# Patient Record
Sex: Female | Born: 1945
Health system: Southern US, Community
[De-identification: ages and names within clinical notes are randomized; demographics above are authoritative.]

## PROBLEM LIST (undated history)

## (undated) DIAGNOSIS — J349 Unspecified disorder of nose and nasal sinuses: Secondary | ICD-10-CM

## (undated) DIAGNOSIS — M4800 Spinal stenosis, site unspecified: Secondary | ICD-10-CM

## (undated) HISTORY — PX: TUBAL LIGATION: SHX77

## (undated) HISTORY — PX: MYOMECTOMY: SHX85

---

## 1999-09-26 ENCOUNTER — Other Ambulatory Visit: Admission: RE | Admit: 1999-09-26 | Discharge: 1999-09-26 | Payer: Self-pay | Admitting: Obstetrics and Gynecology

## 2000-10-08 ENCOUNTER — Other Ambulatory Visit: Admission: RE | Admit: 2000-10-08 | Discharge: 2000-10-08 | Payer: Self-pay | Admitting: Obstetrics and Gynecology

## 2001-10-15 ENCOUNTER — Other Ambulatory Visit: Admission: RE | Admit: 2001-10-15 | Discharge: 2001-10-15 | Payer: Self-pay | Admitting: Obstetrics and Gynecology

## 2003-08-30 ENCOUNTER — Emergency Department (HOSPITAL_COMMUNITY): Admission: EM | Admit: 2003-08-30 | Discharge: 2003-08-30 | Payer: Self-pay | Admitting: Emergency Medicine

## 2003-08-30 ENCOUNTER — Encounter: Payer: Self-pay | Admitting: Emergency Medicine

## 2003-10-09 ENCOUNTER — Other Ambulatory Visit: Admission: RE | Admit: 2003-10-09 | Discharge: 2003-10-09 | Payer: Self-pay | Admitting: Obstetrics and Gynecology

## 2014-02-20 ENCOUNTER — Other Ambulatory Visit: Payer: Self-pay | Admitting: Obstetrics and Gynecology

## 2014-02-20 DIAGNOSIS — R1031 Right lower quadrant pain: Secondary | ICD-10-CM

## 2014-02-23 ENCOUNTER — Ambulatory Visit
Admission: RE | Admit: 2014-02-23 | Discharge: 2014-02-23 | Disposition: A | Discharge status: Home / Self Care | Payer: BC Managed Care – PPO | Source: Ambulatory Visit | Attending: Obstetrics and Gynecology | Admitting: Obstetrics and Gynecology

## 2014-02-23 DIAGNOSIS — R1031 Right lower quadrant pain: Secondary | ICD-10-CM

## 2014-02-23 MED ORDER — IOHEXOL 300 MG/ML  SOLN
100.0000 mL | Freq: Once | INTRAMUSCULAR | Status: AC | PRN
  Administered 2014-02-23: 100 mL via INTRAVENOUS

## 2014-03-08 ENCOUNTER — Encounter: Payer: Self-pay | Admitting: *Deleted

## 2014-03-09 ENCOUNTER — Ambulatory Visit (INDEPENDENT_AMBULATORY_CARE_PROVIDER_SITE_OTHER): Payer: BC Managed Care – PPO | Admitting: Neurology

## 2014-03-09 ENCOUNTER — Encounter: Payer: Self-pay | Admitting: Neurology

## 2014-03-09 ENCOUNTER — Encounter (INDEPENDENT_AMBULATORY_CARE_PROVIDER_SITE_OTHER): Payer: Self-pay

## 2014-03-09 VITALS — BP 120/75 | HR 88 | Ht 59.0 in | Wt 158.0 lb

## 2014-03-09 DIAGNOSIS — R209 Unspecified disturbances of skin sensation: Secondary | ICD-10-CM

## 2014-03-09 DIAGNOSIS — R202 Paresthesia of skin: Secondary | ICD-10-CM

## 2014-03-09 NOTE — Patient Instructions (Signed)
Overall you are doing fairly well but I do want to suggest a few things today:   Remember to drink plenty of fluid, eat healthy meals and do not skip any meals. Try to eat protein with a every meal and eat a healthy snack such as fruit or nuts in between meals. Try to keep a regular sleep-wake schedule and try to exercise daily, particularly in the form of walking, 20-30 minutes a day, if you can.   Please have some blood work completed after checking out Please schedule an EMG/NCS  Please follow up once the workup is completed. Please call us with any interim questions, concerns, problems, updates or refill requests.   My clinical assistant and will answer any of your questions and relay your messages to me and also relay most of my messages to you.   Our phone number is [phone]. We also have an after hours call service for urgent matters and there is a physician on-call for urgent questions. For any emergencies you know to call 911 or go to the nearest emergency room

## 2014-03-09 NOTE — Progress Notes (Signed)
[facility]    Provider:  Dr Janann Colonel Referring Provider: Lovenia Kim, MD Primary Care Physician:  Orpah Melter, MD  CC:  Right sided pain  HPI:  Rebecca Stone is a 68 y.o. female here as a referral from Dr. Ronita Hipps for right sided pain  Symptoms started around 1 month ago, initially noted lumbar back pain, started on a steroid taper pack which resolved the lower back pain. After that, started having a  burning sensation in right inguinal area and the right lateral gastrocnemius. Wakes up feeling fine, after a few hours of walking/standing pain starts, is then constant until she sits down. Pain is only in those 2 spots, is non-radiating. Severe burning pain, not exacerbated by touch of clothes or wind. No symptoms at night while sleeping. Symptoms improved with pain medication. No weakness. No difficulty walking. No symptoms in LLE or bilateral UE. No change in bowel or bladder function.   No DM, no HTN, no strokes or TIAs. Was bit by a dog on right medial gastrocnemius, no sensory or motor changes at that time. Told she had a hematoma in her leg after the dog bite which self resolved.   Review of Systems: Out of a complete 14 system review, the patient complains of only the following symptoms, and all other reviewed systems are negative. + paresthesias  History   Social History  . Marital Status: Married    Spouse Name: N/A    Number of Children: N/A  . Years of Education: N/A   Occupational History  . Not on file.   Social History Main Topics  . Smoking status: Never Smoker   . Smokeless tobacco: Never Used  . Alcohol Use: No  . Drug Use: No  . Sexual Activity: Not on file   Other Topics Concern  . Not on file   Social History Narrative   Married, 1 child   Right handed   11 th grade   3 cups daily    Family History  Problem Relation Age of Onset  . Diabetes Mother   . Diabetes Sister   . Cancer Brother     Lung  . Alzheimer's  disease Father   . Hypertension Mother   . Heart disease Mother   . Heart attack Mother     No past medical history on file.  Past Surgical History  Procedure Laterality Date  . Myomectomy      Current Outpatient Prescriptions  Medication Sig Dispense Refill  . ibuprofen (ADVIL,MOTRIN) 200 MG tablet Take 200 mg by mouth every 6 (six) hours as needed.       No current facility-administered medications for this visit.    Allergies as of 03/09/2014  . (No Known Allergies)    Vitals: BP 120/75  Pulse 88  Ht 4\' 11"  (1.499 m)  Wt 158 lb (71.668 kg)  BMI 31.89 kg/m2 Last Weight:  Wt Readings from Last 1 Encounters:  03/09/14 158 lb (71.668 kg)   Last Height:   Ht Readings from Last 1 Encounters:  03/09/14 4\' 11"  (1.499 m)     Physical exam: Exam: Gen: NAD, conversant Eyes: anicteric sclerae, moist conjunctivae HENT: Atraumatic, oropharynx clear Neck: Trachea midline; supple,  Lungs: CTA, no wheezing, rales, rhonic                          CV: RRR, no MRG Abdomen: Soft, non-tender;  Extremities: No peripheral edema  Skin: Normal temperature, no  rash,  Psych: Appropriate affect, pleasant  Neuro: MS: AA&Ox3, appropriately interactive, normal affect   Attention: WORLD backwards  Speech: fluent w/o paraphasic error  Memory: good recent and remote recall  CN: PERRL, EOMI no nystagmus, no ptosis, sensation intact to LT V1-V3 bilat, face symmetric, no weakness, hearing grossly intact, palate elevates symmetrically, shoulder shrug 5/5 bilat,  tongue protrudes midline, no fasiculations noted.  Motor: normal bulk and tone Strength: 5/5  In all extremities  Coord: rapid alternating and point-to-point (FNF, HTS) movements intact.  Reflexes: symmetrical, bilat downgoing toes  Sens: LT intact in all extremities, intact PP, temp vibration and proprioception in bilateral LE  Gait: posture, stance, stride and arm-swing normal. Tandem gait intact. Able to walk on  heels and toes. Romberg absent.   Assessment:  After physical and neurologic examination, review of laboratory studies, imaging, neurophysiology testing and pre-existing records, assessment will be reviewed on the problem list.  Plan:  Treatment plan and additional workup will be reviewed under Problem List.  1)Paresthesias  68y/o woman presenting for initial evaluation of right sided painful paresthesias located in right inguinal area and lateral portion of right gastrocnemius. Occur intermittently, not present during exam. Unclear etiology. Exam overall unremarkable. Will check lab work up for possible neuropathy and check EMG/NCS. Follow up once workup completed.    Jim Like, DO  [facility] [address] [place], [place] [address]  Phone [phone] Fax [phone]

## 2014-03-10 ENCOUNTER — Ambulatory Visit: Payer: BC Managed Care – PPO | Admitting: Neurology

## 2014-03-13 LAB — PROTEIN ELECTROPHORESIS
A/G Ratio: 1.5 (ref 0.7–2.0)
ALPHA 2: 0.6 g/dL (ref 0.4–1.2)
Albumin ELP: 4.2 g/dL (ref 3.2–5.6)
Alpha 1: 0.2 g/dL (ref 0.1–0.4)
BETA: 1 g/dL (ref 0.6–1.3)
Gamma Globulin: 0.9 g/dL (ref 0.5–1.6)
Globulin, Total: 2.8 g/dL (ref 2.0–4.5)
Total Protein: 7 g/dL (ref 6.0–8.5)

## 2014-03-13 LAB — VITAMIN B12: VITAMIN B 12: 310 pg/mL (ref 211–946)

## 2014-03-13 LAB — HGB A1C W/O EAG: Hgb A1c MFr Bld: 5.9 % — ABNORMAL HIGH (ref 4.8–5.6)

## 2014-03-13 LAB — TSH: TSH: 2.02 u[IU]/mL (ref 0.450–4.500)

## 2014-03-13 LAB — METHYLMALONIC ACID, SERUM: METHYLMALONIC ACID: 179 nmol/L (ref 0–378)

## 2014-03-14 NOTE — Progress Notes (Signed)
Quick Note:  Left voice message of normal labs and elevated blood sugar, and to follow up with PCP for further management of this. ______

## 2014-03-21 ENCOUNTER — Encounter (INDEPENDENT_AMBULATORY_CARE_PROVIDER_SITE_OTHER): Payer: BC Managed Care – PPO

## 2014-03-21 ENCOUNTER — Ambulatory Visit (INDEPENDENT_AMBULATORY_CARE_PROVIDER_SITE_OTHER): Payer: BC Managed Care – PPO | Admitting: Neurology

## 2014-03-21 DIAGNOSIS — Z0289 Encounter for other administrative examinations: Secondary | ICD-10-CM

## 2014-03-21 DIAGNOSIS — R202 Paresthesia of skin: Secondary | ICD-10-CM

## 2014-03-21 DIAGNOSIS — M79609 Pain in unspecified limb: Secondary | ICD-10-CM

## 2014-03-21 NOTE — Procedures (Signed)
     HISTORY:  Rebecca Stone is a 68 year old patient with a history of discomfort in the right inguinal area and in the right calf muscle that began approximately 4 weeks prior to this evaluation. The patient has had some low back pain around the time of onset of symptoms. The patient is being evaluated for possible neuropathy or a lumbosacral radiculopathy.  NERVE CONDUCTION STUDIES:  Nerve conduction studies were performed on both lower extremities. The distal motor latencies and motor amplitudes for the peroneal and posterior tibial nerves were within normal limits. The nerve conduction velocities for these nerves were also normal. The H reflex latencies were normal. The sensory latencies for the peroneal nerves were within normal limits.   EMG STUDIES:  EMG study was performed on the right lower extremity:  The tibialis anterior muscle reveals 2 to 4K motor units with full recruitment. No fibrillations or positive waves were seen. The peroneus tertius muscle reveals 2 to 4K motor units with full recruitment. No fibrillations or positive waves were seen. The medial gastrocnemius muscle reveals 1 to 3K motor units with full recruitment. No fibrillations or positive waves were seen. The vastus lateralis muscle reveals 2 to 4K motor units with full recruitment. No fibrillations or positive waves were seen. The iliopsoas muscle reveals 2 to 4K motor units with full recruitment. No fibrillations or positive waves were seen. The biceps femoris muscle (long head) reveals 2 to 4K motor units with full recruitment. No fibrillations or positive waves were seen. The lumbosacral paraspinal muscles were tested at 3 levels, and revealed no abnormalities of insertional activity at all 3 levels tested. There was good relaxation.   IMPRESSION:  Nerve conduction studies done on both lower extremities were unremarkable, without evidence of a peripheral neuropathy. EMG evaluation of the right lower  extremity was unremarkable, without evidence of an overlying lumbosacral radiculopathy.  Jill Alexanders MD 03/21/2014 2:00 PM  [facility] [address] [place], [place] [address]  Phone [phone] Fax [phone]

## 2014-03-24 ENCOUNTER — Telehealth: Payer: Self-pay | Admitting: Neurology

## 2014-03-24 ENCOUNTER — Other Ambulatory Visit: Payer: Self-pay | Admitting: Neurology

## 2014-03-24 DIAGNOSIS — M5416 Radiculopathy, lumbar region: Secondary | ICD-10-CM

## 2014-03-24 NOTE — Telephone Encounter (Signed)
Spoke with patient's husband and he said should the  wife be requesting a MRI to detect a bad nerve. The patient is no better after all the other test that she has had

## 2014-03-24 NOTE — Telephone Encounter (Signed)
Wants to know if Dr. Janann Colonel will be ordering an MRI for her

## 2014-03-29 ENCOUNTER — Ambulatory Visit (INDEPENDENT_AMBULATORY_CARE_PROVIDER_SITE_OTHER): Payer: BC Managed Care – PPO

## 2014-03-29 DIAGNOSIS — M5416 Radiculopathy, lumbar region: Secondary | ICD-10-CM

## 2014-03-29 DIAGNOSIS — IMO0002 Reserved for concepts with insufficient information to code with codable children: Secondary | ICD-10-CM

## 2014-04-03 NOTE — Progress Notes (Signed)
Quick Note:  Left voice message for patient to f/u with orthopaedic back doctor, any questions or concerns to give the office a call back. ______

## 2014-04-05 ENCOUNTER — Telehealth: Payer: Self-pay | Admitting: Neurology

## 2014-04-05 NOTE — Telephone Encounter (Signed)
Patient calling wanting to discs her MRI results before she sees her bone doctor. Please call and advise patient.

## 2014-04-05 NOTE — Telephone Encounter (Signed)
Spoke to patient and she just wanted to know why she was to make an appt with her orthopedic back doctor. I explained to her it was for her to try steroid injections in her back to help with her pain. Patient verbalizes understating and will try to get an appt. If patient needs any results for appt she will call the office back.

## 2014-04-07 ENCOUNTER — Telehealth: Payer: Self-pay | Admitting: Neurology

## 2014-04-07 NOTE — Telephone Encounter (Signed)
Pt called states the Bone Dr says they will not see pt until they receive all of her medical records and want to know why she is being referred over to them. Please call pt concerning this matter.

## 2014-04-10 ENCOUNTER — Telehealth: Payer: Self-pay | Admitting: *Deleted

## 2014-04-10 ENCOUNTER — Encounter: Payer: Self-pay | Admitting: *Deleted

## 2014-04-10 ENCOUNTER — Other Ambulatory Visit: Payer: Self-pay | Admitting: Neurology

## 2014-04-10 DIAGNOSIS — M5416 Radiculopathy, lumbar region: Secondary | ICD-10-CM

## 2014-04-10 NOTE — Telephone Encounter (Signed)
Called patient and left a voicemail letting her know, Dr. Janann Colonel will be referring her to Dr. Ace Gins a pain doctor and once he gets that in and its processed someone will call from there.

## 2014-04-10 NOTE — Telephone Encounter (Signed)
Please let her know I will place a referral to Dr Ace Gins, a pain doctor, to see if she would benefit from steroid injections to treat her symptoms. She does not need to see her bone doctor

## 2014-04-10 NOTE — Telephone Encounter (Signed)
Refer to message below

## 2014-04-10 NOTE — Telephone Encounter (Addendum)
Referred patient to Dr Neomia Dear, patient is requesting a doctor with a different ethnicity.

## 2014-04-11 ENCOUNTER — Encounter: Payer: Self-pay | Admitting: Neurology

## 2014-06-12 ENCOUNTER — Other Ambulatory Visit: Payer: Self-pay | Admitting: Orthopedic Surgery

## 2014-06-12 DIAGNOSIS — M48061 Spinal stenosis, lumbar region without neurogenic claudication: Secondary | ICD-10-CM

## 2014-06-15 ENCOUNTER — Ambulatory Visit
Admission: RE | Admit: 2014-06-15 | Discharge: 2014-06-15 | Disposition: A | Discharge status: Home / Self Care | Payer: BC Managed Care – PPO | Source: Ambulatory Visit | Attending: Orthopedic Surgery | Admitting: Orthopedic Surgery

## 2014-06-15 ENCOUNTER — Inpatient Hospital Stay
Admission: RE | Admit: 2014-06-15 | Discharge: 2014-06-15 | Disposition: A | Discharge status: Home / Self Care | Payer: Self-pay | Source: Ambulatory Visit | Attending: Orthopedic Surgery | Admitting: Orthopedic Surgery

## 2014-06-15 ENCOUNTER — Other Ambulatory Visit: Payer: Self-pay | Admitting: Orthopedic Surgery

## 2014-06-15 VITALS — BP 109/57 | HR 74

## 2014-06-15 DIAGNOSIS — M48061 Spinal stenosis, lumbar region without neurogenic claudication: Secondary | ICD-10-CM

## 2014-06-15 MED ORDER — IOHEXOL 180 MG/ML  SOLN
15.0000 mL | Freq: Once | INTRAMUSCULAR | Status: AC | PRN
  Administered 2014-06-15: 15 mL via INTRATHECAL

## 2014-06-15 MED ORDER — DIAZEPAM 5 MG PO TABS
5.0000 mg | ORAL_TABLET | Freq: Once | ORAL | Status: AC
  Administered 2014-06-15: 5 mg via ORAL

## 2014-06-15 NOTE — Discharge Instructions (Signed)

## 2014-07-11 ENCOUNTER — Other Ambulatory Visit: Payer: Self-pay | Admitting: Surgical

## 2014-07-24 ENCOUNTER — Encounter (HOSPITAL_COMMUNITY): Payer: Self-pay | Admitting: Pharmacist

## 2014-07-26 ENCOUNTER — Encounter (INDEPENDENT_AMBULATORY_CARE_PROVIDER_SITE_OTHER): Payer: Self-pay

## 2014-07-26 ENCOUNTER — Ambulatory Visit (HOSPITAL_COMMUNITY)
Admission: RE | Admit: 2014-07-26 | Discharge: 2014-07-26 | Disposition: A | Discharge status: Home / Self Care | Payer: BC Managed Care – PPO | Source: Ambulatory Visit | Attending: Surgical | Admitting: Surgical

## 2014-07-26 ENCOUNTER — Encounter (HOSPITAL_COMMUNITY)
Admission: RE | Admit: 2014-07-26 | Discharge: 2014-07-26 | Disposition: A | Discharge status: Home / Self Care | Payer: BC Managed Care – PPO | Source: Ambulatory Visit | Attending: Orthopedic Surgery | Admitting: Orthopedic Surgery

## 2014-07-26 ENCOUNTER — Encounter (HOSPITAL_COMMUNITY): Payer: Self-pay

## 2014-07-26 DIAGNOSIS — M549 Dorsalgia, unspecified: Secondary | ICD-10-CM | POA: Diagnosis present

## 2014-07-26 DIAGNOSIS — Z01818 Encounter for other preprocedural examination: Secondary | ICD-10-CM | POA: Diagnosis not present

## 2014-07-26 HISTORY — DX: Spinal stenosis, site unspecified: M48.00

## 2014-07-26 HISTORY — DX: Unspecified disorder of nose and nasal sinuses: J34.9

## 2014-07-26 LAB — CBC
HCT: 38.3 % (ref 36.0–46.0)
Hemoglobin: 12.9 g/dL (ref 12.0–15.0)
MCH: 29.9 pg (ref 26.0–34.0)
MCHC: 33.7 g/dL (ref 30.0–36.0)
MCV: 88.9 fL (ref 78.0–100.0)
Platelets: 206 10*3/uL (ref 150–400)
RBC: 4.31 MIL/uL (ref 3.87–5.11)
RDW: 13 % (ref 11.5–15.5)
WBC: 5.2 10*3/uL (ref 4.0–10.5)

## 2014-07-26 LAB — URINALYSIS, ROUTINE W REFLEX MICROSCOPIC
Bilirubin Urine: NEGATIVE
Glucose, UA: NEGATIVE mg/dL
Hgb urine dipstick: NEGATIVE
Ketones, ur: NEGATIVE mg/dL
Leukocytes, UA: NEGATIVE
Nitrite: NEGATIVE
Protein, ur: NEGATIVE mg/dL
Specific Gravity, Urine: 1.011 (ref 1.005–1.030)
Urobilinogen, UA: 0.2 mg/dL (ref 0.0–1.0)
pH: 7 (ref 5.0–8.0)

## 2014-07-26 LAB — COMPREHENSIVE METABOLIC PANEL
ALT: 16 U/L (ref 0–35)
AST: 19 U/L (ref 0–37)
Albumin: 3.9 g/dL (ref 3.5–5.2)
Alkaline Phosphatase: 70 U/L (ref 39–117)
Anion gap: 12 (ref 5–15)
BUN: 14 mg/dL (ref 6–23)
CO2: 26 mEq/L (ref 19–32)
Calcium: 9.6 mg/dL (ref 8.4–10.5)
Chloride: 102 mEq/L (ref 96–112)
Creatinine, Ser: 0.81 mg/dL (ref 0.50–1.10)
GFR calc Af Amer: 85 mL/min — ABNORMAL LOW (ref >90)
GFR calc non Af Amer: 73 mL/min — ABNORMAL LOW (ref >90)
Glucose, Bld: 104 mg/dL — ABNORMAL HIGH (ref 70–99)
Potassium: 4 mEq/L (ref 3.7–5.3)
Sodium: 140 mEq/L (ref 137–147)
Total Bilirubin: 0.4 mg/dL (ref 0.3–1.2)
Total Protein: 7.3 g/dL (ref 6.0–8.3)

## 2014-07-26 LAB — PROTIME-INR
INR: 1 (ref 0.00–1.49)
Prothrombin Time: 13.2 seconds (ref 11.6–15.2)

## 2014-07-26 LAB — SURGICAL PCR SCREEN
MRSA, PCR: NEGATIVE
STAPHYLOCOCCUS AUREUS: NEGATIVE

## 2014-07-26 LAB — APTT: aPTT: 37 seconds (ref 24–37)

## 2014-07-26 NOTE — Patient Instructions (Addendum)
[place]  07/26/2014   Your procedure is scheduled on: 9-2  -2015 Wednesday  Enter through [facility] Entrance and follow signs to [facility]. Arrive at  0630      AM .  Call this number if you have problems the morning of surgery: [phone]  Or Presurgical Testing [phone].   For Living Will and/or Health Care Power Attorney Forms: please provide copy for your medical record,may bring AM of surgery(Forms should be already notarized -we do not provide this service).(07-26-14  No information preferred today).    Do not eat food/ or drink: After Midnight.      Take these medicines the morning of surgery with A SIP OF WATER: none. Bring/use eyedrops   Do not wear jewelry, make-up or nail polish.  Do not wear lotions, powders, or perfumes. You may wear deodorant.  Do not shave 48 hours(2 days) prior to first CHG shower(legs and under arms).(Shaving face and neck okay.)  Do not bring valuables to the hospital.(Hospital is not responsible for lost valuables).  Contacts, dentures or removable bridgework, body piercing, hair pins may not be worn into surgery.  Leave suitcase in the car. After surgery it may be brought to your room.  For patients admitted to the hospital, checkout time is 11:00 AM the day of discharge.(Restricted visitors-Any Persons displaying flu-like symptoms or illness).    Patients discharged the day of surgery will not be allowed to drive home. Must have responsible person with you x 24 hours once discharged.  Name and phone number of your driver: Ronald-spouse [phone] h  Special Instructions: CHG(Chlorhedine 4%-"Hibiclens","Betasept","Aplicare") Shower Use Special Wash: see special instructions.(avoid face and genitals)   Please read over the following fact sheets that you were given: MRSA Information, Incentive Spirometry Instruction.  Remember : Type/Screen "Blue armbands" - may not be removed once applied(would result in being  retested AM of surgery, if removed).    _________________________    [facility] - Preparing for Surgery Before surgery, you can play an important role.  Because skin is not sterile, your skin needs to be as free of germs as possible.  You can reduce the number of germs on your skin by washing with CHG (chlorahexidine gluconate) soap before surgery.  CHG is an antiseptic cleaner which kills germs and bonds with the skin to continue killing germs even after washing. Please DO NOT use if you have an allergy to CHG or antibacterial soaps.  If your skin becomes reddened/irritated stop using the CHG and inform your nurse when you arrive at Short Stay. Do not shave (including legs and underarms) for at least 48 hours prior to the first CHG shower.  You may shave your face/neck. Please follow these instructions carefully:  1.  Shower with CHG Soap the night before surgery and the  morning of Surgery.  2.  If you choose to wash your hair, wash your hair first as usual with your  normal  shampoo.  3.  After you shampoo, rinse your hair and body thoroughly to remove the  shampoo.                           4.  Use CHG as you would any other liquid soap.  You can apply chg directly  to the skin and wash  Gently with a scrungie or clean washcloth.  5.  Apply the CHG Soap to your body ONLY FROM THE NECK DOWN.   Do not use on face/ open                           Wound or open sores. Avoid contact with eyes, ears mouth and genitals (private parts).                       Wash face,  Genitals (private parts) with your normal soap.             6.  Wash thoroughly, paying special attention to the area where your surgery  will be performed.  7.  Thoroughly rinse your body with warm water from the neck down.  8.  DO NOT shower/wash with your normal soap after using and rinsing off  the CHG Soap.                9.  Pat yourself dry with a clean towel.            10.  Wear clean pajamas.             11.  Place clean sheets on your bed the night of your first shower and do not  sleep with pets. Day of Surgery : Do not apply any lotions/deodorants the morning of surgery.  Please wear clean clothes to the hospital/surgery center.  FAILURE TO FOLLOW THESE INSTRUCTIONS MAY RESULT IN THE CANCELLATION OF YOUR SURGERY PATIENT SIGNATURE_________________________________  NURSE SIGNATURE__________________________________  ________________________________________________________________________   Adam Phenix  An incentive spirometer is a tool that can help keep your lungs clear and active. This tool measures how well you are filling your lungs with each breath. Taking long deep breaths may help reverse or decrease the chance of developing breathing (pulmonary) problems (especially infection) following:  A long period of time when you are unable to move or be active. BEFORE THE PROCEDURE   If the spirometer includes an indicator to show your best effort, your nurse or respiratory therapist will set it to a desired goal.  If possible, sit up straight or lean slightly forward. Try not to slouch.  Hold the incentive spirometer in an upright position. INSTRUCTIONS FOR USE  1. Sit on the edge of your bed if possible, or sit up as far as you can in bed or on a chair. 2. Hold the incentive spirometer in an upright position. 3. Breathe out normally. 4. Place the mouthpiece in your mouth and seal your lips tightly around it. 5. Breathe in slowly and as deeply as possible, raising the piston or the ball toward the top of the column. 6. Hold your breath for 3-5 seconds or for as long as possible. Allow the piston or ball to fall to the bottom of the column. 7. Remove the mouthpiece from your mouth and breathe out normally. 8. Rest for a few seconds and repeat Steps 1 through 7 at least 10 times every 1-2 hours when you are awake. Take your time and take a few normal breaths between deep  breaths. 9. The spirometer may include an indicator to show your best effort. Use the indicator as a goal to work toward during each repetition. 10. After each set of 10 deep breaths, practice coughing to be sure your lungs are clear. If you have an incision (the cut made at the time of  surgery), support your incision when coughing by placing a pillow or rolled up towels firmly against it. Once you are able to get out of bed, walk around indoors and cough well. You may stop using the incentive spirometer when instructed by your caregiver.  RISKS AND COMPLICATIONS  Take your time so you do not get dizzy or light-headed.  If you are in pain, you may need to take or ask for pain medication before doing incentive spirometry. It is harder to take a deep breath if you are having pain. AFTER USE  Rest and breathe slowly and easily.  It can be helpful to keep track of a log of your progress. Your caregiver can provide you with a simple table to help with this. If you are using the spirometer at home, follow these instructions: [place] IF:   You are having difficultly using the spirometer.  You have trouble using the spirometer as often as instructed.  Your pain medication is not giving enough relief while using the spirometer.  You develop fever of 100.5 F (38.1 C) or higher. SEEK IMMEDIATE MEDICAL CARE IF:   You cough up bloody sputum that had not been present before.  You develop fever of 102 F (38.9 C) or greater.  You develop worsening pain at or near the incision site. MAKE SURE YOU:   Understand these instructions.  Will watch your condition.  Will get help right away if you are not doing well or get worse. Document Released: 03/30/2007 Document Revised: 02/09/2012 Document Reviewed: 05/31/2007 [facility] Patient Information 2014 [place], [place].   ________________________________________________________________________

## 2014-07-26 NOTE — Pre-Procedure Instructions (Signed)
07-26-14 EKG, CXR ,Lumbar spine xray done today.

## 2014-08-02 ENCOUNTER — Encounter (HOSPITAL_COMMUNITY): Payer: Self-pay

## 2014-08-02 ENCOUNTER — Ambulatory Visit (HOSPITAL_COMMUNITY): Payer: BC Managed Care – PPO

## 2014-08-02 ENCOUNTER — Ambulatory Visit (HOSPITAL_COMMUNITY): Payer: BC Managed Care – PPO | Admitting: *Deleted

## 2014-08-02 ENCOUNTER — Encounter (HOSPITAL_COMMUNITY)
Admission: RE | Disposition: A | Discharge status: Home / Self Care | Payer: Self-pay | Source: Ambulatory Visit | Attending: Orthopedic Surgery

## 2014-08-02 ENCOUNTER — Encounter (HOSPITAL_COMMUNITY): Payer: BC Managed Care – PPO | Admitting: *Deleted

## 2014-08-02 ENCOUNTER — Ambulatory Visit (HOSPITAL_COMMUNITY)
Admission: RE | Admit: 2014-08-02 | Discharge: 2014-08-03 | Disposition: A | Discharge status: Home / Self Care | Payer: BC Managed Care – PPO | Source: Ambulatory Visit | Attending: Orthopedic Surgery | Admitting: Orthopedic Surgery

## 2014-08-02 DIAGNOSIS — M48061 Spinal stenosis, lumbar region without neurogenic claudication: Secondary | ICD-10-CM | POA: Insufficient documentation

## 2014-08-02 DIAGNOSIS — M48062 Spinal stenosis, lumbar region with neurogenic claudication: Secondary | ICD-10-CM | POA: Diagnosis present

## 2014-08-02 DIAGNOSIS — M216X9 Other acquired deformities of unspecified foot: Secondary | ICD-10-CM | POA: Diagnosis not present

## 2014-08-02 HISTORY — PX: DECOMPRESSIVE LUMBAR LAMINECTOMY LEVEL 1: SHX5791

## 2014-08-02 LAB — ABO/RH: ABO/RH(D): O POS

## 2014-08-02 LAB — TYPE AND SCREEN
ABO/RH(D): O POS
Antibody Screen: NEGATIVE

## 2014-08-02 SURGERY — DECOMPRESSIVE LUMBAR LAMINECTOMY LEVEL 1
Anesthesia: General | Site: Back

## 2014-08-02 MED ORDER — HYDROMORPHONE HCL PF 1 MG/ML IJ SOLN
0.2500 mg | INTRAMUSCULAR | Status: DC | PRN
  Administered 2014-08-02 (×3): 0.5 mg via INTRAVENOUS

## 2014-08-02 MED ORDER — LIDOCAINE HCL (CARDIAC) 20 MG/ML IV SOLN
INTRAVENOUS | Status: DC | PRN
  Administered 2014-08-02: 50 mg via INTRAVENOUS

## 2014-08-02 MED ORDER — HYDROMORPHONE HCL PF 1 MG/ML IJ SOLN
INTRAMUSCULAR | Status: AC
  Filled 2014-08-02: qty 1

## 2014-08-02 MED ORDER — BUPIVACAINE-EPINEPHRINE (PF) 0.5% -1:200000 IJ SOLN
INTRAMUSCULAR | Status: AC
  Filled 2014-08-02: qty 30

## 2014-08-02 MED ORDER — HYDROMORPHONE HCL PF 1 MG/ML IJ SOLN
0.5000 mg | INTRAMUSCULAR | Status: DC | PRN
  Administered 2014-08-02: 1 mg via INTRAVENOUS
  Filled 2014-08-02: qty 1

## 2014-08-02 MED ORDER — PHENOL 1.4 % MT LIQD: 1.0000 | OROMUCOSAL | Status: DC | PRN

## 2014-08-02 MED ORDER — HYDROCODONE-ACETAMINOPHEN 5-325 MG PO TABS: 1.0000 | ORAL_TABLET | ORAL | Status: DC | PRN

## 2014-08-02 MED ORDER — GLYCOPYRROLATE 0.2 MG/ML IJ SOLN
INTRAMUSCULAR | Status: DC | PRN
  Administered 2014-08-02: 0.6 mg via INTRAVENOUS

## 2014-08-02 MED ORDER — ROCURONIUM BROMIDE 100 MG/10ML IV SOLN
INTRAVENOUS | Status: AC
  Filled 2014-08-02: qty 1

## 2014-08-02 MED ORDER — ACETAMINOPHEN 650 MG RE SUPP: 650.0000 mg | RECTAL | Status: DC | PRN

## 2014-08-02 MED ORDER — ONDANSETRON HCL 4 MG/2ML IJ SOLN
INTRAMUSCULAR | Status: AC
  Filled 2014-08-02: qty 2

## 2014-08-02 MED ORDER — SODIUM CHLORIDE 0.9 % IR SOLN
Status: AC
  Filled 2014-08-02: qty 1

## 2014-08-02 MED ORDER — FENTANYL CITRATE 0.05 MG/ML IJ SOLN
INTRAMUSCULAR | Status: AC
  Filled 2014-08-02: qty 5

## 2014-08-02 MED ORDER — MIDAZOLAM HCL 5 MG/5ML IJ SOLN
INTRAMUSCULAR | Status: DC | PRN
  Administered 2014-08-02 (×2): 1 mg via INTRAVENOUS

## 2014-08-02 MED ORDER — BACITRACIN-NEOMYCIN-POLYMYXIN 400-5-5000 EX OINT
TOPICAL_OINTMENT | CUTANEOUS | Status: DC | PRN
  Administered 2014-08-02: 1 via TOPICAL

## 2014-08-02 MED ORDER — BISACODYL 5 MG PO TBEC: 5.0000 mg | DELAYED_RELEASE_TABLET | Freq: Every day | ORAL | Status: DC | PRN

## 2014-08-02 MED ORDER — DEXAMETHASONE SODIUM PHOSPHATE 10 MG/ML IJ SOLN
INTRAMUSCULAR | Status: DC | PRN
  Administered 2014-08-02: 5 mg via INTRAVENOUS

## 2014-08-02 MED ORDER — BUPIVACAINE-EPINEPHRINE (PF) 0.5% -1:200000 IJ SOLN
INTRAMUSCULAR | Status: DC | PRN
  Administered 2014-08-02: 20 mL

## 2014-08-02 MED ORDER — PROMETHAZINE HCL 25 MG/ML IJ SOLN
6.2500 mg | Freq: Four times a day (QID) | INTRAMUSCULAR | Status: DC | PRN
  Administered 2014-08-02: 12.5 mg via INTRAVENOUS
  Filled 2014-08-02: qty 1

## 2014-08-02 MED ORDER — FLEET ENEMA 7-19 GM/118ML RE ENEM: 1.0000 | ENEMA | Freq: Once | RECTAL | Status: AC | PRN

## 2014-08-02 MED ORDER — MEPERIDINE HCL 50 MG/ML IJ SOLN: 6.2500 mg | INTRAMUSCULAR | Status: DC | PRN

## 2014-08-02 MED ORDER — BACITRACIN-NEOMYCIN-POLYMYXIN 400-5-5000 EX OINT
TOPICAL_OINTMENT | CUTANEOUS | Status: AC
  Filled 2014-08-02: qty 1

## 2014-08-02 MED ORDER — PROMETHAZINE HCL 25 MG/ML IJ SOLN: 6.2500 mg | INTRAMUSCULAR | Status: DC | PRN

## 2014-08-02 MED ORDER — POLYETHYLENE GLYCOL 3350 17 G PO PACK: 17.0000 g | PACK | Freq: Every day | ORAL | Status: DC | PRN

## 2014-08-02 MED ORDER — ROCURONIUM BROMIDE 100 MG/10ML IV SOLN
INTRAVENOUS | Status: DC | PRN
  Administered 2014-08-02: 15 mg via INTRAVENOUS
  Administered 2014-08-02: 45 mg via INTRAVENOUS

## 2014-08-02 MED ORDER — ONDANSETRON HCL 4 MG/2ML IJ SOLN
INTRAMUSCULAR | Status: DC | PRN
  Administered 2014-08-02: 4 mg via INTRAVENOUS

## 2014-08-02 MED ORDER — BUPIVACAINE-EPINEPHRINE (PF) 0.5% -1:200000 IJ SOLN
INTRAMUSCULAR | Status: AC
Start: 2014-08-02 — End: 2014-08-02
  Filled 2014-08-02: qty 30

## 2014-08-02 MED ORDER — ONDANSETRON HCL 4 MG/2ML IJ SOLN
4.0000 mg | INTRAMUSCULAR | Status: DC | PRN
  Administered 2014-08-02: 4 mg via INTRAVENOUS
  Filled 2014-08-02: qty 2

## 2014-08-02 MED ORDER — PROPOFOL 10 MG/ML IV BOLUS
INTRAVENOUS | Status: AC
  Filled 2014-08-02: qty 20

## 2014-08-02 MED ORDER — CEFAZOLIN SODIUM-DEXTROSE 2-3 GM-% IV SOLR
2.0000 g | INTRAVENOUS | Status: AC
  Administered 2014-08-02: 2 g via INTRAVENOUS

## 2014-08-02 MED ORDER — BUPIVACAINE HCL (PF) 0.5 % IJ SOLN
INTRAMUSCULAR | Status: AC
  Filled 2014-08-02: qty 30

## 2014-08-02 MED ORDER — LACTATED RINGERS IV SOLN: INTRAVENOUS | Status: DC

## 2014-08-02 MED ORDER — SODIUM CHLORIDE 0.9 % IR SOLN
Status: DC | PRN
  Administered 2014-08-02: 09:00:00

## 2014-08-02 MED ORDER — METHOCARBAMOL 500 MG PO TABS: 500.0000 mg | ORAL_TABLET | Freq: Four times a day (QID) | ORAL | Status: DC | PRN

## 2014-08-02 MED ORDER — MIDAZOLAM HCL 2 MG/2ML IJ SOLN
INTRAMUSCULAR | Status: AC
  Filled 2014-08-02: qty 2

## 2014-08-02 MED ORDER — LACTATED RINGERS IV SOLN
INTRAVENOUS | Status: DC
  Administered 2014-08-02 (×2): via INTRAVENOUS
  Administered 2014-08-02: 1000 mL via INTRAVENOUS

## 2014-08-02 MED ORDER — OXYCODONE-ACETAMINOPHEN 5-325 MG PO TABS: 1.0000 | ORAL_TABLET | ORAL | Status: DC | PRN

## 2014-08-02 MED ORDER — NEOSTIGMINE METHYLSULFATE 10 MG/10ML IV SOLN
INTRAVENOUS | Status: DC | PRN
  Administered 2014-08-02: 4 mg via INTRAVENOUS

## 2014-08-02 MED ORDER — MENTHOL 3 MG MT LOZG: 1.0000 | LOZENGE | OROMUCOSAL | Status: DC | PRN

## 2014-08-02 MED ORDER — BUPIVACAINE LIPOSOME 1.3 % IJ SUSP
INTRAMUSCULAR | Status: DC | PRN
  Administered 2014-08-02: 20 mL

## 2014-08-02 MED ORDER — ACETAMINOPHEN 325 MG PO TABS
650.0000 mg | ORAL_TABLET | ORAL | Status: DC | PRN
  Administered 2014-08-02 – 2014-08-03 (×2): 650 mg via ORAL
  Filled 2014-08-02 (×2): qty 2

## 2014-08-02 MED ORDER — OXYCODONE-ACETAMINOPHEN 5-325 MG PO TABS
1.0000 | ORAL_TABLET | ORAL | Status: DC | PRN
  Administered 2014-08-02 – 2014-08-03 (×2): 1 via ORAL
  Filled 2014-08-02 (×2): qty 1

## 2014-08-02 MED ORDER — METOCLOPRAMIDE HCL 5 MG/ML IJ SOLN
INTRAMUSCULAR | Status: DC | PRN
  Administered 2014-08-02: 10 mg via INTRAVENOUS

## 2014-08-02 MED ORDER — PHENYLEPHRINE HCL 10 MG/ML IJ SOLN
INTRAMUSCULAR | Status: DC | PRN
  Administered 2014-08-02: 40 ug via INTRAVENOUS

## 2014-08-02 MED ORDER — FENTANYL CITRATE 0.05 MG/ML IJ SOLN
INTRAMUSCULAR | Status: AC
  Filled 2014-08-02: qty 2

## 2014-08-02 MED ORDER — ONDANSETRON HCL 4 MG PO TABS: 4.0000 mg | ORAL_TABLET | Freq: Three times a day (TID) | ORAL | Status: DC | PRN

## 2014-08-02 MED ORDER — DEXTROSE 5 % IV SOLN
500.0000 mg | Freq: Four times a day (QID) | INTRAVENOUS | Status: DC | PRN
  Administered 2014-08-02: 500 mg via INTRAVENOUS
  Filled 2014-08-02: qty 5

## 2014-08-02 MED ORDER — OXYCODONE HCL 5 MG/5ML PO SOLN: 5.0000 mg | Freq: Once | ORAL | Status: DC | PRN

## 2014-08-02 MED ORDER — BUPIVACAINE LIPOSOME 1.3 % IJ SUSP
20.0000 mL | Freq: Once | INTRAMUSCULAR | Status: DC
  Filled 2014-08-02: qty 20

## 2014-08-02 MED ORDER — THROMBIN 5000 UNITS EX SOLR
CUTANEOUS | Status: DC | PRN
  Administered 2014-08-02: 10000 [IU] via TOPICAL

## 2014-08-02 MED ORDER — LIDOCAINE HCL (CARDIAC) 20 MG/ML IV SOLN
INTRAVENOUS | Status: AC
  Filled 2014-08-02: qty 5

## 2014-08-02 MED ORDER — OXYCODONE HCL 5 MG PO TABS: 5.0000 mg | ORAL_TABLET | Freq: Once | ORAL | Status: DC | PRN

## 2014-08-02 MED ORDER — FENTANYL CITRATE 0.05 MG/ML IJ SOLN
INTRAMUSCULAR | Status: DC | PRN
  Administered 2014-08-02 (×3): 50 ug via INTRAVENOUS
  Administered 2014-08-02: 100 ug via INTRAVENOUS

## 2014-08-02 MED ORDER — CEFAZOLIN SODIUM-DEXTROSE 2-3 GM-% IV SOLR
INTRAVENOUS | Status: AC
  Filled 2014-08-02: qty 50

## 2014-08-02 MED ORDER — THROMBIN 5000 UNITS EX SOLR
CUTANEOUS | Status: AC
  Filled 2014-08-02: qty 10000

## 2014-08-02 MED ORDER — PROPOFOL 10 MG/ML IV BOLUS
INTRAVENOUS | Status: DC | PRN
  Administered 2014-08-02: 160 mg via INTRAVENOUS
  Administered 2014-08-02: 20 mg via INTRAVENOUS

## 2014-08-02 MED ORDER — CEFAZOLIN SODIUM 1-5 GM-% IV SOLN
1.0000 g | Freq: Three times a day (TID) | INTRAVENOUS | Status: AC
  Administered 2014-08-02 – 2014-08-03 (×3): 1 g via INTRAVENOUS
  Filled 2014-08-02 (×3): qty 50

## 2014-08-02 SURGICAL SUPPLY — 40 items
BAG ZIPLOCK 12X15 (MISCELLANEOUS) ×3 IMPLANT
BENZOIN TINCTURE PRP APPL 2/3 (GAUZE/BANDAGES/DRESSINGS) ×3 IMPLANT
CLEANER TIP ELECTROSURG 2X2 (MISCELLANEOUS) ×3 IMPLANT
DRAIN PENROSE 18X1/4 LTX STRL (WOUND CARE) IMPLANT
DRAPE MICROSCOPE LEICA (MISCELLANEOUS) ×3 IMPLANT
DRAPE POUCH INSTRU U-SHP 10X18 (DRAPES) ×3 IMPLANT
DRAPE SURG 17X11 SM STRL (DRAPES) ×3 IMPLANT
DRSG ADAPTIC 3X8 NADH LF (GAUZE/BANDAGES/DRESSINGS) ×3 IMPLANT
DRSG PAD ABDOMINAL 8X10 ST (GAUZE/BANDAGES/DRESSINGS) ×9 IMPLANT
DURAPREP 26ML APPLICATOR (WOUND CARE) ×3 IMPLANT
ELECT REM PT RETURN 9FT ADLT (ELECTROSURGICAL) ×3
ELECTRODE REM PT RTRN 9FT ADLT (ELECTROSURGICAL) ×1 IMPLANT
GAUZE SPONGE 4X4 12PLY STRL (GAUZE/BANDAGES/DRESSINGS) ×3 IMPLANT
GLOVE BIOGEL PI IND STRL 8 (GLOVE) ×1 IMPLANT
GLOVE BIOGEL PI INDICATOR 8 (GLOVE) ×2
GLOVE ECLIPSE 8.0 STRL XLNG CF (GLOVE) ×9 IMPLANT
GOWN STRL REUS W/TWL LRG LVL3 (GOWN DISPOSABLE) ×3 IMPLANT
GOWN STRL REUS W/TWL XL LVL3 (GOWN DISPOSABLE) ×6 IMPLANT
KIT BASIN OR (CUSTOM PROCEDURE TRAY) ×3 IMPLANT
KIT POSITIONING SURG ANDREWS (MISCELLANEOUS) ×3 IMPLANT
MANIFOLD NEPTUNE II (INSTRUMENTS) ×3 IMPLANT
NEEDLE SPNL 18GX3.5 QUINCKE PK (NEEDLE) ×9 IMPLANT
NS IRRIG 1000ML POUR BTL (IV SOLUTION) ×3 IMPLANT
PATTIES SURGICAL .5 X.5 (GAUZE/BANDAGES/DRESSINGS) IMPLANT
PATTIES SURGICAL .75X.75 (GAUZE/BANDAGES/DRESSINGS) IMPLANT
PATTIES SURGICAL 1X1 (DISPOSABLE) IMPLANT
PIN SAFETY NICK PLATE  2 MED (MISCELLANEOUS)
PIN SAFETY NICK PLATE 2 MED (MISCELLANEOUS) IMPLANT
POSITIONER SURGICAL ARM (MISCELLANEOUS) ×3 IMPLANT
SPONGE LAP 4X18 X RAY DECT (DISPOSABLE) ×6 IMPLANT
SPONGE SURGIFOAM ABS GEL 100 (HEMOSTASIS) ×3 IMPLANT
STAPLER VISISTAT 35W (STAPLE) ×3 IMPLANT
SUT VIC AB 0 CT1 27 (SUTURE)
SUT VIC AB 0 CT1 27XBRD ANTBC (SUTURE) IMPLANT
SUT VIC AB 1 CT1 27 (SUTURE) ×6
SUT VIC AB 1 CT1 27XBRD ANTBC (SUTURE) ×3 IMPLANT
TOWEL OR 17X26 10 PK STRL BLUE (TOWEL DISPOSABLE) ×3 IMPLANT
TRAY FOLEY CATH 14FRSI W/METER (CATHETERS) ×3 IMPLANT
TRAY LAMINECTOMY (CUSTOM PROCEDURE TRAY) ×3 IMPLANT
WATER STERILE IRR 1500ML POUR (IV SOLUTION) ×3 IMPLANT

## 2014-08-02 NOTE — Anesthesia Postprocedure Evaluation (Signed)
Anesthesia Post Note  Patient: Rebecca Stone  Procedure(s) Performed: Procedure(s) (LRB): CENTRAL DECOMPRESSIVE LUMBAR LAMINECTOMY L4 - L5 LEVEL 1 (N/A)  Anesthesia type: General  Patient location: PACU  Post pain: Pain level controlled  Post assessment: Post-op Vital signs reviewed  Last Vitals: BP 141/87  Pulse 112  Temp(Src) 36.5 C (Oral)  Resp 18  Ht 4\' 10"  (1.473 m)  Wt 154 lb (69.854 kg)  BMI 32.19 kg/m2  SpO2 100%  Post vital signs: Reviewed  Level of consciousness: sedated  Complications: No apparent anesthesia complications

## 2014-08-02 NOTE — Discharge Instructions (Signed)
Change your dressing daily. °Shower only, no tub bath. °Call if any temperatures greater than 101 or any wound complications: 545-5000 during the day and ask for Dr. Gioffre's nurse, Tammy Johnson. °

## 2014-08-02 NOTE — Interval H&P Note (Signed)
History and Physical Interval Note:  08/02/2014 8:18 AM  Rebecca Stone  has presented today for surgery, with the diagnosis of spinal stenosis  The various methods of treatment have been discussed with the patient and family. After consideration of risks, benefits and other options for treatment, the patient has consented to  Procedure(s): CENTRAL DECOMPRESSIVE LUMBAR LAMINECTOMY L4 - L5 LEVEL 1 (N/A) as a surgical intervention .  The patient's history has been reviewed, patient examined, no change in status, stable for surgery.  I have reviewed the patient's chart and labs.  Questions were answered to the patient's satisfaction.     Ahsley Attwood A

## 2014-08-02 NOTE — H&P (Signed)
Rebecca Stone is an 68 y.o. female.   Chief Complaint: pain in Right leg HPI: Patient with sever,progressive pain in right leg.  Past Medical History  Diagnosis Date  . Sinus trouble     occ.  Marland Kitchen Spinal stenosis     surgery planned    Past Surgical History  Procedure Laterality Date  . Myomectomy    . Tubal ligation      Family History  Problem Relation Age of Onset  . Diabetes Mother   . Diabetes Sister   . Cancer Brother     Lung  . Alzheimer's disease Father   . Hypertension Mother   . Heart disease Mother   . Heart attack Mother    Social History:  reports that she has never smoked. She has never used smokeless tobacco. She reports that she does not drink alcohol or use illicit drugs.  Allergies: No Known Allergies  Medications Prior to Admission  Medication Sig Dispense Refill  . acetaminophen (TYLENOL) 500 MG tablet Take 500 mg by mouth every 6 (six) hours as needed.      Marland Kitchen ibuprofen (ADVIL,MOTRIN) 200 MG tablet Take 400 mg by mouth every 8 (eight) hours as needed for moderate pain.       Marland Kitchen OVER THE COUNTER MEDICATION Place 1 drop into both eyes at bedtime as needed (dry eyes). [employer] brand artificial tears.        Results for orders placed during the hospital encounter of 08/02/14 (from the past 48 hour(s))  TYPE AND SCREEN     Status: None   Collection Time    08/02/14  7:00 AM      Result Value Ref Range   ABO/RH(D) O POS     Antibody Screen NEG     Sample Expiration 08/05/2014    ABO/RH     Status: None   Collection Time    08/02/14  7:00 AM      Result Value Ref Range   ABO/RH(D) O POS     No results found.  Review of Systems  Constitutional: Negative.   HENT: Negative.   Eyes: Negative.   Respiratory: Negative.   Cardiovascular: Negative.   Gastrointestinal: Negative.   Genitourinary: Negative.   Musculoskeletal: Positive for back pain.  Skin: Negative.   Neurological: Positive for focal weakness.  Endo/Heme/Allergies: Negative.    Psychiatric/Behavioral: Negative.     Blood pressure 128/57, pulse 77, temperature 97.7 F (36.5 C), temperature source Oral, resp. rate 16, SpO2 100.00%. Physical Exam  Constitutional: She appears well-developed.  HENT:  Head: Normocephalic.  Eyes: Pupils are equal, round, and reactive to light.  Neck: Normal range of motion.  Cardiovascular: Normal rate.   Respiratory: Effort normal.  GI: Soft.  Musculoskeletal:  Positive Straight Leg on the Right and weakness of right Dorsiflexors.  Neurological: She is alert.  Skin: Skin is warm.     Assessment/Plan Decompressive Lumbar Laminectomy of L-4-L-5  Rebecca Stone A 08/02/2014, 8:13 AM

## 2014-08-02 NOTE — Transfer of Care (Signed)
Immediate Anesthesia Transfer of Care Note  Patient: Rebecca Stone  Procedure(s) Performed: Procedure(s): CENTRAL DECOMPRESSIVE LUMBAR LAMINECTOMY L4 - L5 LEVEL 1 (N/A)  Patient Location: PACU  Anesthesia Type:General  Level of Consciousness: awake, oriented and patient cooperative  Airway & Oxygen Therapy: Patient Spontanous Breathing and Patient connected to face mask oxygen  Post-op Assessment: Report given to PACU RN, Post -op Vital signs reviewed and stable and Patient moving all extremities  Post vital signs: Reviewed and stable  Complications: No apparent anesthesia complications

## 2014-08-02 NOTE — Brief Op Note (Signed)
08/02/2014  10:35 AM  PATIENT:  Rebecca Stone  68 y.o. female  PRE-OPERATIVE DIAGNOSIS:  spinal stenosis L-4-L-5 with weakness of Dorsiflexors on the right. Foraminal Stenosis of L-4 and L-5 nerv roots on the left  POST-OPERATIVE DIAGNOSIS:  Same as Pre-Op  PROCEDURE:  Procedure(s): CENTRAL DECOMPRESSIVE LUMBAR LAMINECTOMY L4 - L5 LEVEL 1 (N/A) and foraminotomies of L-4 and L-5 nerve roots.  SURGEON:  Surgeon(s) and Role:    * Tobi Bastos, MD - Primary    * Magnus Sinning, MD - Assisting     ASSISTANTS: Tarri Glenn MD   ANESTHESIA:   general  EBL:  Total I/O In: 1000 [I.V.:1000] Out: 185 [Urine:135; Blood:50]  BLOOD ADMINISTERED:none  DRAINS: none   LOCAL MEDICATIONS USED:  MARCAINE 20cc of 0.5% with Epinephrine at the start of the case and 20cc of Exparel at the end of the case.    SPECIMEN:  No Specimen  DISPOSITION OF SPECIMEN:  N/A  COUNTS:  YES  TOURNIQUET:  * No tourniquets in log *  DICTATION: .Other Dictation: Dictation Number [id number]  PLAN OF CARE: Admit to inpatient   PATIENT DISPOSITION:  Stable   Delay start of Pharmacological VTE agent (>24hrs) due to surgical blood loss or risk of bleeding: yes

## 2014-08-02 NOTE — Anesthesia Preprocedure Evaluation (Addendum)
Anesthesia Evaluation  Patient identified by MRN, date of birth, ID band Patient awake    Reviewed: Allergy & Precautions, H&P , NPO status , Patient's Chart, lab work & pertinent test results  Airway Mallampati: II TM Distance: >3 FB Neck ROM: Full    Dental no notable dental hx.    Pulmonary neg pulmonary ROS,  breath sounds clear to auscultation  Pulmonary exam normal       Cardiovascular negative cardio ROS  Rhythm:Regular Rate:Normal     Neuro/Psych negative neurological ROS  negative psych ROS   GI/Hepatic negative GI ROS, Neg liver ROS,   Endo/Other  negative endocrine ROS  Renal/GU negative Renal ROS     Musculoskeletal negative musculoskeletal ROS (+)   Abdominal   Peds  Hematology negative hematology ROS (+)   Anesthesia Other Findings   Reproductive/Obstetrics negative OB ROS                           Anesthesia Physical Anesthesia Plan  ASA: II  Anesthesia Plan: General   Post-op Pain Management:    Induction: Intravenous  Airway Management Planned: Oral ETT  Additional Equipment:   Intra-op Plan:   Post-operative Plan: Extubation in OR  Informed Consent: I have reviewed the patients History and Physical, chart, labs and discussed the procedure including the risks, benefits and alternatives for the proposed anesthesia with the patient or authorized representative who has indicated his/her understanding and acceptance.   Dental advisory given  Plan Discussed with: CRNA  Anesthesia Plan Comments:         Anesthesia Quick Evaluation

## 2014-08-03 ENCOUNTER — Encounter (HOSPITAL_COMMUNITY): Payer: Self-pay | Admitting: Orthopedic Surgery

## 2014-08-03 DIAGNOSIS — M48061 Spinal stenosis, lumbar region without neurogenic claudication: Secondary | ICD-10-CM | POA: Diagnosis not present

## 2014-08-03 LAB — GLUCOSE, CAPILLARY: Glucose-Capillary: 119 mg/dL — ABNORMAL HIGH (ref 70–99)

## 2014-08-03 MED ORDER — ONDANSETRON HCL 4 MG PO TABS
4.0000 mg | ORAL_TABLET | ORAL | Status: DC | PRN
  Administered 2014-08-03: 4 mg via ORAL
  Filled 2014-08-03: qty 1

## 2014-08-03 NOTE — Progress Notes (Signed)
RNs Tewana Bohlen and Cleophas Dunker went to see pt for discharge instructions at 0940. Pt sitting up in chair, dressed and ready.  IV catheter removed and gauze dressing on lower back changed. Pt suddenly reported feeling dizzy and hot.  Room temperature turned down, cool cloth applied to forehead and ice applied to back of neck with pt in sitting.  A few minutes later (about 10-15 minutes after RNs entered room), pt had two quick body twitches, leaned slightly to the left, and was asked if she was ok.  Pt's eyes were open, but she wasn't verbalizing a response.  Continued to ask pt, while RN shook her shoulders to alert her, but pt nonverbal for about 1-2 minutes.  Eyes open except briefly closing for about 10 seconds toward the end of period.  During first minute, rapid response call initiated.  After this 1-2 minutes, VS taken: BP 73/44, HR 63, RR 16, O2 sats 99% on RA, regular respiration and HR when auscultated.  Pt returned to bed with assistance, laying down VS:  BP 130/68, HR 105.  After about 5 minutes, BP 118/57, HR 80.  Pt reported feeling better.  Rapid response nurse present during assessments and involved in care of pt. PA was contacted.  No new orders given.  Will continue to monitor pt.

## 2014-08-03 NOTE — Evaluation (Signed)
Physical Therapy Evaluation Patient Details Name: Rebecca Stone MRN: [id number] DOB: 06/13/1946 Today's Date: 08/03/2014   History of Present Illness  CENTRAL DECOMPRESSIVE LUMBAR LAMINECTOMY L4 - L5 LEVEL   Clinical Impression  Pt tolerated ambulation well, no  Issues with hypotension. Ready for DC.    Follow Up Recommendations No PT follow up    Equipment Recommendations  None recommended by PT    Recommendations for Other Services       Precautions / Restrictions Precautions Precautions: Back      Mobility  Bed Mobility   Bed Mobility: Sit to Supine       Sit to supine: Min assist   General bed mobility comments: assist for legs onto bed, cues for technique  Transfers Overall transfer level: Needs assistance Equipment used: 1 person hand held assist Transfers: Sit to/from Stand Sit to Stand: Min guard         General transfer comment: from recliner, mod I from toilet  Ambulation/Gait Ambulation/Gait assistance: Min guard   Assistive device: 1 person hand held assist Gait Pattern/deviations: Step-through pattern Gait velocity: slow, guarded disatance 100'   General Gait Details: pt  moves slowly, declined RW but does better with HH of 1  Stairs            Wheelchair Mobility    Modified Rankin (Stroke Patients Only)       Balance                                             Pertinent Vitals/Pain Pain Score: 3  Pain Descriptors / Indicators: Aching;Tightness Pain Intervention(s): Monitored during session;Repositioned    Home Living Family/patient expects to be discharged to:: Private residence Living Arrangements: Spouse/significant other Available Help at Discharge: Family Type of Home: House Home Access: Stairs to enter     Home Layout: One level Home Equipment: None      Prior Function Level of Independence: Independent               Hand Dominance        Extremity/Trunk Assessment   Upper  Extremity Assessment: Overall WFL for tasks assessed           Lower Extremity Assessment: Generalized weakness         Communication   Communication: No difficulties  Cognition Arousal/Alertness: Awake/alert Behavior During Therapy: WFL for tasks assessed/performed Overall Cognitive Status: Within Functional Limits for tasks assessed                      General Comments      Exercises        Assessment/Plan    PT Assessment Patent does not need any further PT services  PT Diagnosis     PT Problem List    PT Treatment Interventions     PT Goals (Current goals can be found in the Care Plan section) Acute Rehab PT Goals Patient Stated Goal: get home today and my husband will take care of me PT Goal Formulation: No goals set, d/c therapy    Frequency     Barriers to discharge        Co-evaluation               End of Session   Activity Tolerance: Patient tolerated treatment well Patient left: in bed;with call bell/phone within reach;with family/visitor present  Nurse Communication: Mobility status    Functional Assessment Tool Used: clinical judgement Functional Limitation: Mobility: Walking and moving around Mobility: Walking and Moving Around Current Status [phone]): At least 1 percent but less than 20 percent impaired, limited or restricted Mobility: Walking and Moving Around Goal Status [phone]): At least 1 percent but less than 20 percent impaired, limited or restricted Mobility: Walking and Moving Around Discharge Status [phone]): At least 1 percent but less than 20 percent impaired, limited or restricted    Time: 1300-1319 PT Time Calculation (min): 19 min   Charges:   PT Evaluation $Initial PT Evaluation Tier I: 1 Procedure PT Treatments $Gait Training: 8-22 mins   PT G Codes:   Functional Assessment Tool Used: clinical judgement Functional Limitation: Mobility: Walking and moving around    [place] 08/03/2014, 4:51  PM

## 2014-08-03 NOTE — Progress Notes (Signed)
Subjective: 1 Day Post-Op Procedure(s) (LRB): CENTRAL DECOMPRESSIVE LUMBAR LAMINECTOMY L4 - L5 LEVEL 1 (N/A) Patient reports pain as 1 on 0-10 scale. Doing Well. Foot function now normal Dorsiflexion.Will DC   Objective: Vital signs in last 24 hours: Temp:  [97.3 F (36.3 C)-98.5 F (36.9 C)] 98.2 F (36.8 C) (09/03 0622) Pulse Rate:  [61-112] 70 (09/03 0622) Resp:  [11-19] 16 (09/03 0622) BP: (99-141)/(53-87) 119/55 mmHg (09/03 0622) SpO2:  [96 %-100 %] 98 % (09/03 0622)  Intake/Output from previous day: 09/02 0701 - 09/03 0700 In: 2530 [P.O.:480; I.V.:2050] Out: 435 [Urine:385; Blood:50] Intake/Output this shift:    No results found for this basename: HGB,  in the last 72 hours No results found for this basename: WBC, RBC, HCT, PLT,  in the last 72 hours No results found for this basename: NA, K, CL, CO2, BUN, CREATININE, GLUCOSE, CALCIUM,  in the last 72 hours No results found for this basename: LABPT, INR,  in the last 72 hours  Neurologically intact  Assessment/Plan: 1 Day Post-Op Procedure(s) (LRB): CENTRAL DECOMPRESSIVE LUMBAR LAMINECTOMY L4 - L5 LEVEL 1 (N/A) Discharge home with home health  Mouhamad Teed A 08/03/2014, 7:22 AM

## 2014-08-03 NOTE — Care Management Note (Signed)
    Page 1 of 1   08/03/2014     11:30:17 AM CARE MANAGEMENT NOTE 08/03/2014  Patient:  [facility]   Account Number:  [id number]  Date Initiated:  08/03/2014  Documentation initiated by:  [facility]  Subjective/Objective Assessment:   outpt in bed     Action/Plan:   lives at home with spouse   Anticipated DC Date:  08/03/2014   Anticipated DC Plan:  HOME/SELF CARE         Choice offered to / List presented to:             Status of service:  Completed, signed off Medicare Important Message given?   (If response is "NO", the following Medicare IM given date fields will be blank) Date Medicare IM given:   Medicare IM given by:   Date Additional Medicare IM given:   Additional Medicare IM given by:    Discharge Disposition:  HOME/SELF CARE  Per UR Regulation:    If discussed at Long Length of Stay Meetings, dates discussed:    Comments:  08/03/14 11:25 Pt is outpt in bed and plan is for discharge today; no needs.  Mariane Masters, BSN, CM [phone].

## 2014-08-03 NOTE — Progress Notes (Signed)
Rt was called to room 1605 for rapid response. When rt arrived pt appeared to have passed out. Vitals stable RN and MD at bedside. There is nothing for respiratory at this time.

## 2014-08-03 NOTE — Evaluation (Signed)
Occupational Therapy Evaluation Patient Details Name: Rebecca Stone MRN: [id number] DOB: [birth year] Today's Date: 08/24/14    History of Present Illness CENTRAL DECOMPRESSIVE LUMBAR LAMINECTOMY L4 - L5 LEVEL    Clinical Impression   All education completed regarding back precautions and ADL activity    Follow Up Recommendations  No OT follow up          Precautions / Restrictions Precautions Precautions: Back      Mobility Bed Mobility Overal bed mobility: Needs Assistance Bed Mobility: Supine to Sit     Supine to sit: Min guard     General bed mobility comments: verbal cues for safety and back precautions  Transfers Overall transfer level: Needs assistance Equipment used: 1 person hand held assist Transfers: Sit to/from Stand Sit to Stand: Supervision         General transfer comment: verbal cues for safety and back precautions         ADL Overall ADL's : Needs assistance/impaired     Grooming: Sitting;Supervision/safety   Upper Body Bathing: Supervision/ safety;Sitting   Lower Body Bathing: Supervison/ safety;Sit to/from stand   Upper Body Dressing : Supervision/safety;Sitting   Lower Body Dressing: Sit to/from stand;Supervision/safety   Toilet Transfer: Supervision/safety;Regular Toilet   Toileting- Clothing Manipulation and Hygiene: Supervision/safety;Sit to/from stand   Tub/ Shower Transfer: Supervision/safety;Walk-in shower   Functional mobility during ADLs: Supervision/safety General ADL Comments: verbal cues for safety and back precautions               Pertinent Vitals/Pain Pain Assessment: 0-10 Pain Score: 2      Hand Dominance     Extremity/Trunk Assessment Upper Extremity Assessment Upper Extremity Assessment: Overall WFL for tasks assessed           Communication Communication Communication: No difficulties   Cognition Arousal/Alertness: Awake/alert Behavior During Therapy: WFL for tasks  assessed/performed Overall Cognitive Status: Within Functional Limits for tasks assessed                     General Comments    husband will help as needed            Home Living Family/patient expects to be discharged to:: Private residence Living Arrangements: Spouse/significant other Available Help at Discharge: Family Type of Home: House Home Access: Stairs to enter Technical brewer of Steps: 1   Home Layout: One level     Bathroom Shower/Tub: Occupational psychologist: Standard     Home Equipment: None          Prior Functioning/Environment Level of Independence: Independent                      OT Goals(Current goals can be found in the care plan section) Acute Rehab OT Goals Patient Stated Goal: get home today and my husband will take care of me Time For Goal Achievement: 08-24-14  OT Frequency:      End of Session Nurse Communication: Mobility status  Activity Tolerance: Patient tolerated treatment well Patient left: in bed   Time: 0900-0939 OT Time Calculation (min): 39 min Charges:  OT General Charges $OT Visit: 1 Procedure OT Evaluation $Initial OT Evaluation Tier I: 1 Procedure OT Treatments $Self Care/Home Management : 23-37 mins G-Codes:    Payton Mccallum D 24-Aug-2014, 9:58 AM

## 2014-08-03 NOTE — Significant Event (Signed)
Rapid Response Event Note  Overview: Time Called: 0900 Arrival Time: 0955 Event Type: Other (Comment)  Initial Focused Assessment: Called to check on pt who had become unresponsive while sitting in chair.  Pt now being assisted back to bed by PT  Staff.  Pt states she got too hot and felt nauseated.   Nurse reports that pt  appeared to jerk her shoulders two times and then became unresponsive which lasted about 1 min.  Pt was dressed to go home, no IV access.  BP was initially  Around 70's. PEARL.  Grips good.     Interventions: Placed on monitor with SR no ectopy.  BP 130/68 HR 92 R 28  CBG 119  Dr Charlies Silvers on unit and stepped in to check on pt.   10:08 BP 118/57 HR 92 R 24.  Pt feeling much better says she just got too hot.    Event Summary: Name of Physician Notified: Dr Gladstone Lighter  called by bedside RN at      at    Outcome: Stayed in room and stabalized     Gwynn Burly

## 2014-08-03 NOTE — Op Note (Signed)
NAMEKORRIN, WATERFIELD NO.:  [id number]  MEDICAL RECORD NO.:  [id number]  LOCATION:  5409                         FACILITY:  [facility]  PHYSICIAN:  Kipp Brood. Anjel Perfetti, M.D.DATE OF BIRTH:  13-Dec-1945  DATE OF PROCEDURE: DATE OF DISCHARGE:                              OPERATIVE REPORT   SURGEON:  [place] Warriner A. Gladstone Lighter, MD  ASSISTANT:  Tarri Glenn, MD  PREOPERATIVE DIAGNOSES: 1. Spinal stenosis at L4-5. 2. Partial footdrop on the right with right leg pain only secondary to     the above. 3. Foraminal stenosis of the L4 and L5 roots on the right.  POSTOPERATIVE DIAGNOSES: 1. Spinal stenosis at L4-5. 2. Partial footdrop on the right with right leg pain only secondary to     the above. 3. Foraminal stenosis of the L4 and L5 roots on the right.  OPERATION: 1. Complete decompressive lumbar laminectomy at L4-5 for spinal     stenosis. 2. Complete decompression of the lateral recess on the right, recess     stenosis. 3. Foraminotomies for the L5-4 root and the L5 root on the right.  DESCRIPTION OF PROCEDURE:  Under general anesthesia, routine orthopedic prep and draping of the lower back was carried out.  Appropriate time- out was first carried out.  I also marked the appropriate right side of the back even though we went central, brought in the holding area.  I marked the right side of the back because partial footdrop was on the right and all her pain was on the right.  After sterile prep and draping of the patient on spinal table, she had 2 g of IV Ancef.  I then put 2 needles on the back for localization purposes.  X-ray was taken.  We then made an incision over the L4-5 space, extended the incision proximally and distally.  I then stripped the muscle from the lamina spinous process bilaterally.  At that particular time, we then took another x-ray with Kocher clamps on the spinous processes.  Following that, we then began our decompressive lumbar laminectomy  after we inserted the [facility] retractors.  We went down central to L4-5 went far out laterally on the left, then on the right, we did partial facetectomies on the right because of the compression to the L5 root.  We then went down distally and did a nice foraminotomy and the microscope was used by the way.  We protected the dura at all time with cottonoids, removed all the ligamentum flavum.  We went distally into the foramen of L5 root, completely decompressed that went up the lateral recess, identified the nerve, gently retracted it, there was really no soft herniated disk.  There was more of a hard disk, the nerve root now was totally free, the dura was free.  We went proximally, decompressed the root above, did a foraminotomy of the L4 root as well.  So we had nice decompression bilaterally.  We were able to easily pass a hockey-stick in both directions without any difficulty. The wound was thoroughly irrigated and some loosely applied thrombin- soaked Gelfoam.  At this time, we then closed the wound layers in usual fashion.  I did inject 20  mL of Exparel into the soft tissue at the end of the procedure.  At the beginning of procedure, I injected 20 mL of 0.5% Marcaine and epinephrine in the soft tissue to cut down on the bleeding.  I left a small deep distal and proximal part of the wound open for drainage purposes.  Subcu was closed with #1 Vicryl, the skin with metal staples. Sterile dressings were applied.          ______________________________ Kipp Brood Gladstone Lighter, M.D.     RAG/MEDQ  D:  08/02/2014  T:  08/03/2014  Job:  [id number]

## 2014-08-03 NOTE — Discharge Summary (Signed)
Pt was able to sit up in chair and, later, participate in PT without c/o dizziness.  VSS in sitting.  Reviewed discharge instructions including follow up, medications, and precautions/restrictions.  Pt/spouse verbalized understanding.  Pt requested pain and nausea meds prior to discharge to address pain/potential nausea concerns.  Pain management strategies and d/c medication discussed and pt/spouse verbalized understanding.

## 2014-08-06 ENCOUNTER — Emergency Department (HOSPITAL_BASED_OUTPATIENT_CLINIC_OR_DEPARTMENT_OTHER)
Admission: EM | Admit: 2014-08-06 | Discharge: 2014-08-06 | Disposition: A | Discharge status: Home / Self Care | Payer: BC Managed Care – PPO | Attending: Emergency Medicine | Admitting: Emergency Medicine

## 2014-08-06 ENCOUNTER — Emergency Department (HOSPITAL_BASED_OUTPATIENT_CLINIC_OR_DEPARTMENT_OTHER): Payer: BC Managed Care – PPO

## 2014-08-06 ENCOUNTER — Encounter (HOSPITAL_BASED_OUTPATIENT_CLINIC_OR_DEPARTMENT_OTHER): Payer: Self-pay | Admitting: Emergency Medicine

## 2014-08-06 DIAGNOSIS — Z79899 Other long term (current) drug therapy: Secondary | ICD-10-CM | POA: Insufficient documentation

## 2014-08-06 DIAGNOSIS — K5909 Other constipation: Secondary | ICD-10-CM

## 2014-08-06 DIAGNOSIS — Z8739 Personal history of other diseases of the musculoskeletal system and connective tissue: Secondary | ICD-10-CM | POA: Diagnosis not present

## 2014-08-06 DIAGNOSIS — K5900 Constipation, unspecified: Secondary | ICD-10-CM | POA: Diagnosis present

## 2014-08-06 DIAGNOSIS — Z8709 Personal history of other diseases of the respiratory system: Secondary | ICD-10-CM | POA: Insufficient documentation

## 2014-08-06 MED ORDER — SENNOSIDES-DOCUSATE SODIUM 8.6-50 MG PO TABS: 1.0000 | ORAL_TABLET | Freq: Every evening | ORAL | Status: DC | PRN

## 2014-08-06 MED ORDER — MINERAL OIL RE ENEM: 1.0000 | ENEMA | Freq: Once | RECTAL | Status: DC

## 2014-08-06 MED ORDER — SODIUM CHLORIDE 0.9 % IV BOLUS (SEPSIS): 1000.0000 mL | Freq: Once | INTRAVENOUS | Status: DC

## 2014-08-06 NOTE — ED Notes (Signed)
Patient transported to X-ray 

## 2014-08-06 NOTE — ED Provider Notes (Signed)
CSN: [id number]     Arrival date & time 08/06/14  1807 History   First MD Initiated Contact with Patient 08/06/14 1826     Chief Complaint  Patient presents with  . Constipation     (Consider location/radiation/quality/duration/timing/severity/associated sxs/prior Treatment) HPI  Rebecca Stone is a 68 y.o. female complaining of constipation with last bowel movement 5 days ago. Patient had decompressive lumbar laminectomy by Dr. Gladstone Lighter 4 days ago. She's been taking postop narcotics, stool softener, has been using Fleet enemas and also prune juice and MiraLAX with little relief. Patient reports a lower abdominal discomfort. She denies fever, chills, nausea, vomiting. Patient has not been physically very active states that she was instructed to not twist or bend.  Past Medical History  Diagnosis Date  . Sinus trouble     occ.  Marland Kitchen Spinal stenosis     surgery planned   Past Surgical History  Procedure Laterality Date  . Myomectomy    . Tubal ligation    . Decompressive lumbar laminectomy level 1 N/A 08/02/2014    Procedure: CENTRAL DECOMPRESSIVE LUMBAR LAMINECTOMY L4 - L5 LEVEL 1;  Surgeon: Tobi Bastos, MD;  Location: [facility]S;  Service: Orthopedics;  Laterality: N/A;   Family History  Problem Relation Age of Onset  . Diabetes Mother   . Diabetes Sister   . Cancer Brother     Lung  . Alzheimer's disease Father   . Hypertension Mother   . Heart disease Mother   . Heart attack Mother    History  Substance Use Topics  . Smoking status: Never Smoker   . Smokeless tobacco: Never Used  . Alcohol Use: No   OB History   Grav Para Term Preterm Abortions TAB SAB Ect Mult Living                 Review of Systems  10 systems reviewed and found to be negative, except as noted in the HPI.   Allergies  Review of patient's allergies indicates no known allergies.  Home Medications   Prior to Admission medications   Medication Sig Start Date End Date Taking? Authorizing  Provider  ibuprofen (ADVIL,MOTRIN) 200 MG tablet Take 400 mg by mouth every 8 (eight) hours as needed for moderate pain.     Historical Provider, MD  methocarbamol (ROBAXIN) 500 MG tablet Take 1 tablet (500 mg total) by mouth every 6 (six) hours as needed for muscle spasms. 08/02/14   Amber Renelda Loma, PA-C  ondansetron (ZOFRAN) 4 MG tablet Take 1 tablet (4 mg total) by mouth every 8 (eight) hours as needed for nausea or vomiting. 08/02/14   Amber Renelda Loma, PA-C  oxyCODONE-acetaminophen (PERCOCET/ROXICET) 5-325 MG per tablet Take 1-2 tablets by mouth every 4 (four) hours as needed for moderate pain. 08/02/14   Amber Renelda Loma, PA-C  senna-docusate (SENOKOT-S) 8.6-50 MG per tablet Take 1 tablet by mouth at bedtime as needed for mild constipation. 08/06/14   Mackinley Cassaday, PA-C   BP 148/73  Pulse 95  Temp(Src) 98.4 F (36.9 C) (Oral)  Resp 16  Wt 154 lb (69.854 kg)  SpO2 100% Physical Exam  Nursing note and vitals reviewed. Constitutional: She is oriented to person, place, and time. She appears well-developed and well-nourished. No distress.  HENT:  Head: Normocephalic.  Mouth/Throat: Oropharynx is clear and moist.  Eyes: Conjunctivae and EOM are normal. Pupils are equal, round, and reactive to light.  Cardiovascular: Normal rate, regular rhythm and intact distal pulses.   Pulmonary/Chest:  Effort normal and breath sounds normal. No stridor.  Abdominal: Soft. Bowel sounds are normal. She exhibits no distension and no mass. There is no tenderness. There is no rebound and no guarding.  Genitourinary:  Rectal exam a chaperoned by technician:  There is a single external hemorrhoids, no rashes or lesions, normal rectal tone. There is a significant amount of hard stool in the rectal vault. Disimpaction attempted however patient became very uncomfortable and DC'd procedure.  Musculoskeletal: Normal range of motion.  Neurological: She is alert and oriented to person, place, and  time.  Skin:  Well-healing lumbar surgical incision with staples in place, clean dry and intact. No induration or discharge.  Psychiatric: She has a normal mood and affect.    ED Course  Procedures (including critical care time) Labs Review Labs Reviewed  URINALYSIS, ROUTINE W REFLEX MICROSCOPIC    Imaging Review No results found.   EKG Interpretation None      MDM   Final diagnoses:  Other constipation    Filed Vitals:   08/06/14 1812  BP: 148/73  Pulse: 95  Temp: 98.4 F (36.9 C)  TempSrc: Oral  Resp: 16  Weight: 154 lb (69.854 kg)  SpO2: 100%    Medications  mineral oil enema 1 enema (1 enema Rectal Not Given 08/06/14 1954)    Rebecca Stone is a 68 y.o. female presenting with constipation likely secondary to opioid pain medication status post decompressive laminectomy last week. This impaction attempted with moderate relief. Patient refusing blood work. When patient was transported to x-ray she had a large bowel movement. Would like to be discharged at this time.  Evaluation does not show pathology that would require ongoing emergent intervention or inpatient treatment. Pt is hemodynamically stable and mentating appropriately. Discussed findings and plan with patient/guardian, who agrees with care plan. All questions answered. Return precautions discussed and outpatient follow up given.   New Prescriptions   SENNA-DOCUSATE (SENOKOT-S) 8.6-50 MG PER TABLET    Take 1 tablet by mouth at bedtime as needed for mild constipation.         Monico Blitz, PA-C 08/06/14 2009

## 2014-08-06 NOTE — ED Notes (Signed)
Reports recent back surgery on Wednesday. No BM since. Taking oxycodone for pain. Tried stool softener with no relief

## 2014-08-06 NOTE — ED Notes (Addendum)
Patient was unable to give urine sample as she already urinated in same basin as stool. I replaced bedside commode basin, then left urine cup at bedside in case patient is able to try for sample again.

## 2014-08-06 NOTE — Discharge Instructions (Signed)
Please follow with your primary care doctor in the next 2 days for a check-up. They must obtain records for further management.   Do not hesitate to return to the Emergency Department for any new, worsening or concerning symptoms.    Constipation Constipation is when a person has fewer than three bowel movements a week, has difficulty having a bowel movement, or has stools that are dry, hard, or larger than normal. As people grow older, constipation is more common. If you try to fix constipation with medicines that make you have a bowel movement (laxatives), the problem may get worse. Long-term laxative use may cause the muscles of the colon to become weak. A low-fiber diet, not taking in enough fluids, and taking certain medicines may make constipation worse.  CAUSES   Certain medicines, such as antidepressants, pain medicine, iron supplements, antacids, and water pills.   Certain diseases, such as diabetes, irritable bowel syndrome (IBS), thyroid disease, or depression.   Not drinking enough water.   Not eating enough fiber-rich foods.   Stress or travel.   Lack of physical activity or exercise.   Ignoring the urge to have a bowel movement.   Using laxatives too much.  SIGNS AND SYMPTOMS   Having fewer than three bowel movements a week.   Straining to have a bowel movement.   Having stools that are hard, dry, or larger than normal.   Feeling full or bloated.   Pain in the lower abdomen.   Not feeling relief after having a bowel movement.  DIAGNOSIS  Your health care provider will take a medical history and perform a physical exam. Further testing may be done for severe constipation. Some tests may include:  A barium enema X-ray to examine your rectum, colon, and, sometimes, your small intestine.   A sigmoidoscopy to examine your lower colon.   A colonoscopy to examine your entire colon. TREATMENT  Treatment will depend on the severity of your  constipation and what is causing it. Some dietary treatments include drinking more fluids and eating more fiber-rich foods. Lifestyle treatments may include regular exercise. If these diet and lifestyle recommendations do not help, your health care provider may recommend taking over-the-counter laxative medicines to help you have bowel movements. Prescription medicines may be prescribed if over-the-counter medicines do not work.  HOME CARE INSTRUCTIONS   Eat foods that have a lot of fiber, such as fruits, vegetables, whole grains, and beans.  Limit foods high in fat and processed sugars, such as french fries, hamburgers, cookies, candies, and soda.   A fiber supplement may be added to your diet if you cannot get enough fiber from foods.   Drink enough fluids to keep your urine clear or pale yellow.   Exercise regularly or as directed by your health care provider.   Go to the restroom when you have the urge to go. Do not hold it.   Only take over-the-counter or prescription medicines as directed by your health care provider. Do not take other medicines for constipation without talking to your health care provider first.  [place] IF:   You have bright red blood in your stool.   Your constipation lasts for more than 4 days or gets worse.   You have abdominal or rectal pain.   You have thin, pencil-like stools.   You have unexplained weight loss. MAKE SURE YOU:   Understand these instructions.  Will watch your condition.  Will get help right away if you are  not doing well or get worse. Document Released: 08/15/2004 Document Revised: 11/22/2013 Document Reviewed: 08/29/2013 [facility] Patient Information 2015 [place], [place]. This information is not intended to replace advice given to you by your health care provider. Make sure you discuss any questions you have with your health care provider.

## 2014-08-09 NOTE — ED Provider Notes (Signed)
Medical screening examination/treatment/procedure(s) were performed by non-physician practitioner and as supervising physician I was immediately available for consultation/collaboration.   EKG Interpretation None        Ephraim Hamburger, MD 08/09/14 [phone]

## 2014-10-18 ENCOUNTER — Encounter: Payer: Self-pay | Admitting: Neurology

## 2014-10-24 ENCOUNTER — Encounter: Payer: Self-pay | Admitting: Neurology

## 2016-11-04 DIAGNOSIS — H01003 Unspecified blepharitis right eye, unspecified eyelid: Secondary | ICD-10-CM | POA: Diagnosis not present

## 2016-11-04 DIAGNOSIS — H02834 Dermatochalasis of left upper eyelid: Secondary | ICD-10-CM | POA: Diagnosis not present

## 2016-11-04 DIAGNOSIS — H01006 Unspecified blepharitis left eye, unspecified eyelid: Secondary | ICD-10-CM | POA: Diagnosis not present

## 2016-11-04 DIAGNOSIS — H527 Unspecified disorder of refraction: Secondary | ICD-10-CM | POA: Diagnosis not present

## 2016-11-04 DIAGNOSIS — H25813 Combined forms of age-related cataract, bilateral: Secondary | ICD-10-CM | POA: Diagnosis not present

## 2017-01-22 DIAGNOSIS — J0140 Acute pansinusitis, unspecified: Secondary | ICD-10-CM | POA: Diagnosis not present

## 2017-01-22 DIAGNOSIS — B373 Candidiasis of vulva and vagina: Secondary | ICD-10-CM | POA: Diagnosis not present

## 2017-01-22 DIAGNOSIS — J209 Acute bronchitis, unspecified: Secondary | ICD-10-CM | POA: Diagnosis not present

## 2017-02-23 DIAGNOSIS — J180 Bronchopneumonia, unspecified organism: Secondary | ICD-10-CM | POA: Diagnosis not present

## 2017-12-03 DIAGNOSIS — B029 Zoster without complications: Secondary | ICD-10-CM | POA: Diagnosis not present

## 2017-12-29 DIAGNOSIS — B029 Zoster without complications: Secondary | ICD-10-CM | POA: Diagnosis not present

## 2017-12-29 DIAGNOSIS — B0229 Other postherpetic nervous system involvement: Secondary | ICD-10-CM | POA: Diagnosis not present

## 2018-01-21 DIAGNOSIS — B0229 Other postherpetic nervous system involvement: Secondary | ICD-10-CM | POA: Diagnosis not present

## 2018-01-29 DIAGNOSIS — B0229 Other postherpetic nervous system involvement: Secondary | ICD-10-CM | POA: Diagnosis not present

## 2018-01-29 DIAGNOSIS — R110 Nausea: Secondary | ICD-10-CM | POA: Diagnosis not present

## 2018-01-30 DIAGNOSIS — R197 Diarrhea, unspecified: Secondary | ICD-10-CM | POA: Diagnosis not present

## 2018-01-30 DIAGNOSIS — R51 Headache: Secondary | ICD-10-CM | POA: Diagnosis not present

## 2018-01-30 DIAGNOSIS — B0229 Other postherpetic nervous system involvement: Secondary | ICD-10-CM | POA: Diagnosis not present

## 2018-01-30 DIAGNOSIS — R112 Nausea with vomiting, unspecified: Secondary | ICD-10-CM | POA: Diagnosis not present

## 2018-01-30 DIAGNOSIS — Z79899 Other long term (current) drug therapy: Secondary | ICD-10-CM | POA: Diagnosis not present

## 2018-04-23 DIAGNOSIS — Z1231 Encounter for screening mammogram for malignant neoplasm of breast: Secondary | ICD-10-CM | POA: Diagnosis not present

## 2018-05-17 DIAGNOSIS — L304 Erythema intertrigo: Secondary | ICD-10-CM | POA: Diagnosis not present

## 2018-05-17 DIAGNOSIS — M792 Neuralgia and neuritis, unspecified: Secondary | ICD-10-CM | POA: Diagnosis not present

## 2018-07-02 ENCOUNTER — Other Ambulatory Visit: Payer: Self-pay

## 2018-07-02 ENCOUNTER — Emergency Department (HOSPITAL_BASED_OUTPATIENT_CLINIC_OR_DEPARTMENT_OTHER): Payer: Medicare HMO

## 2018-07-02 ENCOUNTER — Emergency Department (HOSPITAL_BASED_OUTPATIENT_CLINIC_OR_DEPARTMENT_OTHER)
Admission: EM | Admit: 2018-07-02 | Discharge: 2018-07-02 | Disposition: A | Discharge status: Home / Self Care | Payer: Medicare HMO | Attending: Emergency Medicine | Admitting: Emergency Medicine

## 2018-07-02 ENCOUNTER — Encounter (HOSPITAL_BASED_OUTPATIENT_CLINIC_OR_DEPARTMENT_OTHER): Payer: Self-pay | Admitting: Emergency Medicine

## 2018-07-02 DIAGNOSIS — R079 Chest pain, unspecified: Secondary | ICD-10-CM

## 2018-07-02 DIAGNOSIS — R0789 Other chest pain: Secondary | ICD-10-CM | POA: Diagnosis not present

## 2018-07-02 DIAGNOSIS — R7989 Other specified abnormal findings of blood chemistry: Secondary | ICD-10-CM | POA: Diagnosis not present

## 2018-07-02 LAB — BASIC METABOLIC PANEL
ANION GAP: 10 (ref 5–15)
BUN: 14 mg/dL (ref 8–23)
CO2: 25 mmol/L (ref 22–32)
Calcium: 8.7 mg/dL — ABNORMAL LOW (ref 8.9–10.3)
Chloride: 104 mmol/L (ref 98–111)
Creatinine, Ser: 0.83 mg/dL (ref 0.44–1.00)
GFR calc Af Amer: >60 mL/min (ref >60)
Glucose, Bld: 127 mg/dL — ABNORMAL HIGH (ref 70–99)
Potassium: 3.2 mmol/L — ABNORMAL LOW (ref 3.5–5.1)
Sodium: 139 mmol/L (ref 135–145)

## 2018-07-02 LAB — CBC
HCT: 37.7 % (ref 36.0–46.0)
HEMOGLOBIN: 12.7 g/dL (ref 12.0–15.0)
MCH: 31.1 pg (ref 26.0–34.0)
MCHC: 33.7 g/dL (ref 30.0–36.0)
MCV: 92.4 fL (ref 78.0–100.0)
PLATELETS: 214 10*3/uL (ref 150–400)
RBC: 4.08 MIL/uL (ref 3.87–5.11)
RDW: 12.8 % (ref 11.5–15.5)
WBC: 7.7 10*3/uL (ref 4.0–10.5)

## 2018-07-02 LAB — TROPONIN I
Troponin I: <0.03 ng/mL (ref <0.03)
Troponin I: <0.03 ng/mL (ref <0.03)

## 2018-07-02 LAB — D-DIMER, QUANTITATIVE (NOT AT ARMC): D DIMER QUANT: 0.7 ug{FEU}/mL — AB (ref 0.00–0.50)

## 2018-07-02 MED ORDER — POTASSIUM CHLORIDE CRYS ER 20 MEQ PO TBCR
40.0000 meq | EXTENDED_RELEASE_TABLET | Freq: Once | ORAL | Status: AC
  Administered 2018-07-02: 40 meq via ORAL
  Filled 2018-07-02: qty 2

## 2018-07-02 MED ORDER — IOPAMIDOL (ISOVUE-370) INJECTION 76%
100.0000 mL | Freq: Once | INTRAVENOUS | Status: AC | PRN
  Administered 2018-07-02: 100 mL via INTRAVENOUS

## 2018-07-02 NOTE — ED Provider Notes (Signed)
[facility] Provider Note: Georgena Spurling, MD, FACEP  CSN: [id number] MRN: [id number] ARRIVAL: 07/02/18 at [place]: [id number]   CHIEF COMPLAINT  Chest Pain   HISTORY OF PRESENT ILLNESS  07/02/18 3:00 AM Rebecca Stone is a 72 y.o. female who woke this morning with a sharp pain across her chest radiating to her right back.  There was no associated shortness of breath or nausea but she did have some diaphoresis.  She took a baby aspirin and ate some crackers without relief and so came to the ED.  Her pain subsequently resolved but she has had one additional episode of pain.  Nothing makes the pain better or worse.  She is pain-free at the moment.  She describes the pain is moderate to severe at its worst.   Past Medical History:  Diagnosis Date  . Sinus trouble    occ.  Marland Kitchen Spinal stenosis    surgery planned    Past Surgical History:  Procedure Laterality Date  . DECOMPRESSIVE LUMBAR LAMINECTOMY LEVEL 1 N/A 08/02/2014   Procedure: CENTRAL DECOMPRESSIVE LUMBAR LAMINECTOMY L4 - L5 LEVEL 1;  Surgeon: Tobi Bastos, MD;  Location: [facility];  Service: Orthopedics;  Laterality: N/A;  . MYOMECTOMY    . TUBAL LIGATION      Family History  Problem Relation Age of Onset  . Diabetes Mother   . Hypertension Mother   . Heart disease Mother   . Heart attack Mother   . Alzheimer's disease Father   . Diabetes Sister   . Cancer Brother        Lung    Social History   Tobacco Use  . Smoking status: Never Smoker  . Smokeless tobacco: Never Used  Substance Use Topics  . Alcohol use: No  . Drug use: No    Prior to Admission medications   Not on File    Allergies Patient has no known allergies.   REVIEW OF SYSTEMS  Negative except as noted here or in the History of Present Illness.   PHYSICAL EXAMINATION  Initial Vital Signs Blood pressure (!) 147/74, pulse 78, temperature 97.9 F (36.6 C), temperature source Oral, resp. rate 12, height 4\' 10"  (1.473 m),  weight 71.7 kg (158 lb), SpO2 99 %.  Examination General: Well-developed, well-nourished female in no acute distress; appearance consistent with age of record HENT: normocephalic; atraumatic Eyes: pupils equal, round and reactive to light; extraocular muscles intact Neck: supple Heart: regular rate and rhythm; no murmur Lungs: clear to auscultation bilaterally Abdomen: soft; nondistended; nontender; no masses or hepatosplenomegaly; bowel sounds present Extremities: No deformity; full range of motion; pulses normal; no edema Neurologic: Awake, alert and oriented; motor function intact in all extremities and symmetric; no facial droop Skin: Warm and dry Psychiatric: Normal mood and affect   RESULTS  Summary of this visit's results, reviewed by myself:   EKG Interpretation  Date/Time:  Friday July 02 2018 01:47:17 EDT Ventricular Rate:  74 PR Interval:    QRS Duration: 88 QT Interval:  397 QTC Calculation: 441 R Axis:   11 Text Interpretation:  Sinus rhythm Abnormal R-wave progression, early transition No significant change was found Confirmed by Eugenia Eldredge [phone]) on 07/02/2018 3:00:30 AM      Laboratory Studies: Results for orders placed or performed during the hospital encounter of 07/02/18 (from the past 24 hour(s))  Basic metabolic panel     Status: Abnormal   Collection Time: 07/02/18  1:32 AM  Result Value Ref Range  Sodium 139 135 - 145 mmol/L   Potassium 3.2 (L) 3.5 - 5.1 mmol/L   Chloride 104 98 - 111 mmol/L   CO2 25 22 - 32 mmol/L   Glucose, Bld 127 (H) 70 - 99 mg/dL   BUN 14 8 - 23 mg/dL   Creatinine, Ser 0.83 0.44 - 1.00 mg/dL   Calcium 8.7 (L) 8.9 - 10.3 mg/dL   GFR calc non Af Amer >60 >60 mL/min   GFR calc Af Amer >60 >60 mL/min   Anion gap 10 5 - 15  CBC     Status: None   Collection Time: 07/02/18  1:32 AM  Result Value Ref Range   WBC 7.7 4.0 - 10.5 K/uL   RBC 4.08 3.87 - 5.11 MIL/uL   Hemoglobin 12.7 12.0 - 15.0 g/dL   HCT 37.7 36.0 - 46.0 %     MCV 92.4 78.0 - 100.0 fL   MCH 31.1 26.0 - 34.0 pg   MCHC 33.7 30.0 - 36.0 g/dL   RDW 12.8 11.5 - 15.5 %   Platelets 214 150 - 400 K/uL  Troponin I     Status: None   Collection Time: 07/02/18  1:32 AM  Result Value Ref Range   Troponin I <0.03 <0.03 ng/mL  D-dimer, quantitative (not at [facility])     Status: Abnormal   Collection Time: 07/02/18  1:32 AM  Result Value Ref Range   D-Dimer, Quant 0.70 (H) 0.00 - 0.50 ug/mL-FEU  Troponin I     Status: None   Collection Time: 07/02/18  4:27 AM  Result Value Ref Range   Troponin I <0.03 <0.03 ng/mL   Imaging Studies: Dg Chest 2 View  Result Date: 07/02/2018 CLINICAL DATA:  Patient woke with sharp chest pain radiating to the back. EXAM: CHEST - 2 VIEW COMPARISON:  08/06/2014 CXR FINDINGS: The heart size and mediastinal contours are within normal limits. No mediastinal widening. Nonaneurysmal thoracic aorta. Mild emphysematous appearance of the lungs with attenuated pulmonary vessels. No pulmonary consolidation or pneumothorax. The visualized skeletal structures are unremarkable. IMPRESSION: Mild pulmonary emphysema. Nonaneurysmal thoracic aorta. No mediastinal widening. Electronically Signed   By: Ashley Royalty M.D.   On: 07/02/2018 02:57   Ct Angio Chest Pe W And/or Wo Contrast  Result Date: 07/02/2018 CLINICAL DATA:  Awoke with chest pain. PE suspected, intermediate prob, positive D-dimer EXAM: CT ANGIOGRAPHY CHEST WITH CONTRAST TECHNIQUE: Multidetector CT imaging of the chest was performed using the standard protocol during bolus administration of intravenous contrast. Multiplanar CT image reconstructions and MIPs were obtained to evaluate the vascular anatomy. CONTRAST:  172mL ISOVUE-370 IOPAMIDOL (ISOVUE-370) INJECTION 76% COMPARISON:  Radiographs earlier this day. FINDINGS: Cardiovascular: There are no filling defects within the pulmonary arteries to suggest pulmonary embolus. Thoracic aorta is normal in caliber with mild atherosclerosis. No  dissection. Conventional branching pattern from the aortic arch. Heart is normal in size. No pericardial effusion. Mediastinum/Nodes: No mediastinal or hilar adenopathy. The esophagus is decompressed. No thyroid nodule. Lungs/Pleura: Mild biapical pleuroparenchymal scarring. Subsegmental atelectasis in the lingula. No consolidation. No pulmonary edema. No pleural fluid. Trachea and mainstem bronchi are patent. Upper Abdomen: No acute findings. Musculoskeletal: There are no acute or suspicious osseous abnormalities. Review of the MIP images confirms the above findings. IMPRESSION: 1. No pulmonary embolus. 2. Aortic atherosclerosis without dissection or acute aortic abnormality. 3. No acute chest finding. Aortic Atherosclerosis (ICD10-I70.0). Electronically Signed   By: Jeb Levering M.D.   On: 07/02/2018 04:17    ED COURSE  and MDM  Nursing notes and initial vitals signs, including pulse oximetry, reviewed.  Vitals:   07/02/18 0125 07/02/18 0128 07/02/18 0302  BP:  (!) 147/74 (!) 134/55  Pulse:  78 74  Resp:  12 14  Temp:  97.9 F (36.6 C) 98 F (36.7 C)  TempSrc:  Oral Oral  SpO2:  99% 96%  Weight: 71.7 kg (158 lb)    Height: 4\' 10"  (1.473 m)     5:07 AM Patient remains pain-free.  CT scan was reassuring and she has had two negative troponins.  She does not wish to be admitted to the hospital.  She was advised to contact her primary care physician later today to arrange follow-up or call 9 1 would should symptoms return.  PROCEDURES    ED DIAGNOSES     ICD-10-CM   1. Nonspecific chest pain R07.9        Mally Gavina, Jenny Reichmann, MD 07/02/18 [phone]

## 2018-07-02 NOTE — ED Notes (Signed)
Patient transported to CT 

## 2018-07-02 NOTE — ED Triage Notes (Addendum)
Pt states she awoke with sharp pain across chest and radiating to back. Pt denies any n/v, denies shob. Pt took 1 baby asa at home PTA. Pt reports pt has decreased since onset and is now described as dull.

## 2018-07-02 NOTE — ED Notes (Signed)
Pt returned from CT °

## 2019-01-26 DIAGNOSIS — H25813 Combined forms of age-related cataract, bilateral: Secondary | ICD-10-CM | POA: Diagnosis not present

## 2019-01-26 DIAGNOSIS — H02834 Dermatochalasis of left upper eyelid: Secondary | ICD-10-CM | POA: Diagnosis not present

## 2019-01-26 DIAGNOSIS — H0100A Unspecified blepharitis right eye, upper and lower eyelids: Secondary | ICD-10-CM | POA: Diagnosis not present

## 2019-01-26 DIAGNOSIS — H0100B Unspecified blepharitis left eye, upper and lower eyelids: Secondary | ICD-10-CM | POA: Diagnosis not present

## 2019-01-26 DIAGNOSIS — H527 Unspecified disorder of refraction: Secondary | ICD-10-CM | POA: Diagnosis not present

## 2019-01-26 DIAGNOSIS — H02831 Dermatochalasis of right upper eyelid: Secondary | ICD-10-CM | POA: Diagnosis not present

## 2019-01-26 DIAGNOSIS — H52213 Irregular astigmatism, bilateral: Secondary | ICD-10-CM | POA: Diagnosis not present

## 2019-02-04 DIAGNOSIS — H25813 Combined forms of age-related cataract, bilateral: Secondary | ICD-10-CM | POA: Diagnosis not present

## 2019-02-04 DIAGNOSIS — H52213 Irregular astigmatism, bilateral: Secondary | ICD-10-CM | POA: Diagnosis not present

## 2019-05-07 DIAGNOSIS — Z1159 Encounter for screening for other viral diseases: Secondary | ICD-10-CM | POA: Diagnosis not present

## 2019-05-07 DIAGNOSIS — H25811 Combined forms of age-related cataract, right eye: Secondary | ICD-10-CM | POA: Diagnosis not present

## 2019-05-07 DIAGNOSIS — Z01818 Encounter for other preprocedural examination: Secondary | ICD-10-CM | POA: Diagnosis not present

## 2019-05-10 DIAGNOSIS — H25811 Combined forms of age-related cataract, right eye: Secondary | ICD-10-CM | POA: Diagnosis not present

## 2019-05-10 DIAGNOSIS — H0100B Unspecified blepharitis left eye, upper and lower eyelids: Secondary | ICD-10-CM | POA: Diagnosis not present

## 2019-05-10 DIAGNOSIS — H02831 Dermatochalasis of right upper eyelid: Secondary | ICD-10-CM | POA: Diagnosis not present

## 2019-05-10 DIAGNOSIS — H278 Other specified disorders of lens: Secondary | ICD-10-CM | POA: Diagnosis not present

## 2019-05-10 DIAGNOSIS — Z91041 Radiographic dye allergy status: Secondary | ICD-10-CM | POA: Diagnosis not present

## 2019-05-10 DIAGNOSIS — H02834 Dermatochalasis of left upper eyelid: Secondary | ICD-10-CM | POA: Diagnosis not present

## 2019-05-10 DIAGNOSIS — H25813 Combined forms of age-related cataract, bilateral: Secondary | ICD-10-CM | POA: Diagnosis not present

## 2019-05-10 DIAGNOSIS — Z79899 Other long term (current) drug therapy: Secondary | ICD-10-CM | POA: Diagnosis not present

## 2019-05-10 DIAGNOSIS — Z83518 Family history of other specified eye disorder: Secondary | ICD-10-CM | POA: Diagnosis not present

## 2019-05-10 DIAGNOSIS — H52223 Regular astigmatism, bilateral: Secondary | ICD-10-CM | POA: Diagnosis not present

## 2019-05-11 DIAGNOSIS — H25812 Combined forms of age-related cataract, left eye: Secondary | ICD-10-CM | POA: Diagnosis not present

## 2019-05-11 DIAGNOSIS — Z961 Presence of intraocular lens: Secondary | ICD-10-CM | POA: Diagnosis not present

## 2019-05-11 DIAGNOSIS — H52213 Irregular astigmatism, bilateral: Secondary | ICD-10-CM | POA: Diagnosis not present

## 2019-05-14 DIAGNOSIS — Z1159 Encounter for screening for other viral diseases: Secondary | ICD-10-CM | POA: Diagnosis not present

## 2019-05-14 DIAGNOSIS — Z01812 Encounter for preprocedural laboratory examination: Secondary | ICD-10-CM | POA: Diagnosis not present

## 2019-05-14 DIAGNOSIS — H25819 Combined forms of age-related cataract, unspecified eye: Secondary | ICD-10-CM | POA: Diagnosis not present

## 2019-05-17 DIAGNOSIS — H0100B Unspecified blepharitis left eye, upper and lower eyelids: Secondary | ICD-10-CM | POA: Diagnosis not present

## 2019-05-17 DIAGNOSIS — H527 Unspecified disorder of refraction: Secondary | ICD-10-CM | POA: Diagnosis not present

## 2019-05-17 DIAGNOSIS — H02834 Dermatochalasis of left upper eyelid: Secondary | ICD-10-CM | POA: Diagnosis not present

## 2019-05-17 DIAGNOSIS — H02831 Dermatochalasis of right upper eyelid: Secondary | ICD-10-CM | POA: Diagnosis not present

## 2019-05-17 DIAGNOSIS — H52213 Irregular astigmatism, bilateral: Secondary | ICD-10-CM | POA: Diagnosis not present

## 2019-05-17 DIAGNOSIS — Z809 Family history of malignant neoplasm, unspecified: Secondary | ICD-10-CM | POA: Diagnosis not present

## 2019-05-17 DIAGNOSIS — H0100A Unspecified blepharitis right eye, upper and lower eyelids: Secondary | ICD-10-CM | POA: Diagnosis not present

## 2019-05-17 DIAGNOSIS — E669 Obesity, unspecified: Secondary | ICD-10-CM | POA: Diagnosis not present

## 2019-05-17 DIAGNOSIS — H25812 Combined forms of age-related cataract, left eye: Secondary | ICD-10-CM | POA: Diagnosis not present

## 2019-05-17 DIAGNOSIS — H25813 Combined forms of age-related cataract, bilateral: Secondary | ICD-10-CM | POA: Diagnosis not present

## 2019-05-18 DIAGNOSIS — Z961 Presence of intraocular lens: Secondary | ICD-10-CM | POA: Diagnosis not present

## 2019-05-18 DIAGNOSIS — H0100B Unspecified blepharitis left eye, upper and lower eyelids: Secondary | ICD-10-CM | POA: Diagnosis not present

## 2019-05-18 DIAGNOSIS — H52213 Irregular astigmatism, bilateral: Secondary | ICD-10-CM | POA: Diagnosis not present

## 2019-05-18 DIAGNOSIS — H02831 Dermatochalasis of right upper eyelid: Secondary | ICD-10-CM | POA: Diagnosis not present

## 2019-05-18 DIAGNOSIS — H0100A Unspecified blepharitis right eye, upper and lower eyelids: Secondary | ICD-10-CM | POA: Diagnosis not present

## 2019-05-18 DIAGNOSIS — H527 Unspecified disorder of refraction: Secondary | ICD-10-CM | POA: Diagnosis not present

## 2019-05-18 DIAGNOSIS — H02834 Dermatochalasis of left upper eyelid: Secondary | ICD-10-CM | POA: Diagnosis not present

## 2019-06-09 DIAGNOSIS — Z961 Presence of intraocular lens: Secondary | ICD-10-CM | POA: Diagnosis not present

## 2019-09-01 DIAGNOSIS — Z961 Presence of intraocular lens: Secondary | ICD-10-CM | POA: Diagnosis not present

## 2019-09-01 DIAGNOSIS — H527 Unspecified disorder of refraction: Secondary | ICD-10-CM | POA: Diagnosis not present

## 2019-09-05 DIAGNOSIS — Z1231 Encounter for screening mammogram for malignant neoplasm of breast: Secondary | ICD-10-CM | POA: Diagnosis not present

## 2020-08-20 DIAGNOSIS — U071 COVID-19: Secondary | ICD-10-CM | POA: Diagnosis not present

## 2020-08-20 DIAGNOSIS — J22 Unspecified acute lower respiratory infection: Secondary | ICD-10-CM | POA: Diagnosis not present

## 2020-08-20 DIAGNOSIS — R05 Cough: Secondary | ICD-10-CM | POA: Diagnosis not present

## 2020-08-20 DIAGNOSIS — R197 Diarrhea, unspecified: Secondary | ICD-10-CM | POA: Diagnosis not present

## 2020-08-21 ENCOUNTER — Other Ambulatory Visit: Payer: Self-pay | Admitting: Oncology

## 2020-08-21 DIAGNOSIS — U071 COVID-19: Secondary | ICD-10-CM

## 2020-08-21 NOTE — Progress Notes (Signed)
I connected by phone with Mrs. Wernert to discuss the potential use of an new treatment for mild to moderate COVID-19 viral infection in non-hospitalized patients.   This patient is a age/sex that meets the FDA criteria for Emergency Use Authorization of casirivimab\imdevimab.  Has a (+) direct SARS-CoV-2 viral test result 1. Has mild or moderate COVID-19  2. Is ? 73 years of age and weighs ? 40 kg 3. Is NOT hospitalized due to COVID-19 4. Is NOT requiring oxygen therapy or requiring an increase in baseline oxygen flow rate due to COVID-19 5. Is within 10 days of symptom onset 6. Has at least one of the high risk factor(s) for progression to severe COVID-19 and/or hospitalization as defined in EUA. Specific high risk criteria :  Past Medical History:  Diagnosis Date  . Sinus trouble    occ.  Marland Kitchen Spinal stenosis    surgery planned  ?  ?    Symptom onset  08/16/2020.   I have spoken and communicated the following to the patient or parent/caregiver:   1. FDA has authorized the emergency use of casirivimab\imdevimab for the treatment of mild to moderate COVID-19 in adults and pediatric patients with positive results of direct SARS-CoV-2 viral testing who are 1 years of age and older weighing at least 40 kg, and who are at high risk for progressing to severe COVID-19 and/or hospitalization.   2. The significant known and potential risks and benefits of casirivimab\imdevimab, and the extent to which such potential risks and benefits are unknown.   3. Information on available alternative treatments and the risks and benefits of those alternatives, including clinical trials.   4. Patients treated with casirivimab\imdevimab should continue to self-isolate and use infection control measures (e.g., wear mask, isolate, social distance, avoid sharing personal items, clean and disinfect "high touch" surfaces, and frequent handwashing) according to CDC guidelines.    5. The patient or  parent/caregiver has the option to accept or refuse casirivimab\imdevimab .   After reviewing this information with the patient, The patient agreed to proceed with receiving casirivimab\imdevimab infusion and will be provided a copy of the Fact sheet prior to receiving the infusion.Rulon Abide, AGNP-C [phone] ([place])

## 2020-08-22 ENCOUNTER — Emergency Department (HOSPITAL_COMMUNITY): Payer: Medicare HMO

## 2020-08-22 ENCOUNTER — Emergency Department (HOSPITAL_COMMUNITY)
Admission: EM | Admit: 2020-08-22 | Discharge: 2020-08-23 | Disposition: A | Discharge status: Home / Self Care | Payer: Medicare HMO | Attending: Emergency Medicine | Admitting: Emergency Medicine

## 2020-08-22 ENCOUNTER — Other Ambulatory Visit (HOSPITAL_COMMUNITY): Payer: Self-pay

## 2020-08-22 ENCOUNTER — Ambulatory Visit (HOSPITAL_COMMUNITY)
Admission: RE | Admit: 2020-08-22 | Discharge: 2020-08-22 | Disposition: A | Discharge status: Home / Self Care | Payer: Medicare Other | Source: Ambulatory Visit | Attending: Pulmonary Disease | Admitting: Pulmonary Disease

## 2020-08-22 ENCOUNTER — Encounter (HOSPITAL_COMMUNITY): Payer: Self-pay

## 2020-08-22 ENCOUNTER — Other Ambulatory Visit: Payer: Self-pay

## 2020-08-22 DIAGNOSIS — Z23 Encounter for immunization: Secondary | ICD-10-CM | POA: Diagnosis not present

## 2020-08-22 DIAGNOSIS — E860 Dehydration: Secondary | ICD-10-CM | POA: Insufficient documentation

## 2020-08-22 DIAGNOSIS — R112 Nausea with vomiting, unspecified: Secondary | ICD-10-CM | POA: Diagnosis not present

## 2020-08-22 DIAGNOSIS — R4020 Unspecified coma: Secondary | ICD-10-CM | POA: Diagnosis not present

## 2020-08-22 DIAGNOSIS — E1165 Type 2 diabetes mellitus with hyperglycemia: Secondary | ICD-10-CM | POA: Diagnosis not present

## 2020-08-22 DIAGNOSIS — U071 COVID-19: Secondary | ICD-10-CM | POA: Insufficient documentation

## 2020-08-22 DIAGNOSIS — I951 Orthostatic hypotension: Secondary | ICD-10-CM | POA: Diagnosis not present

## 2020-08-22 DIAGNOSIS — R55 Syncope and collapse: Secondary | ICD-10-CM | POA: Diagnosis not present

## 2020-08-22 DIAGNOSIS — I959 Hypotension, unspecified: Secondary | ICD-10-CM | POA: Diagnosis not present

## 2020-08-22 LAB — CBC WITH DIFFERENTIAL/PLATELET
Abs Immature Granulocytes: 0.03 10*3/uL (ref 0.00–0.07)
Basophils Absolute: 0 10*3/uL (ref 0.0–0.1)
Basophils Relative: 0 %
Eosinophils Absolute: 0 10*3/uL (ref 0.0–0.5)
Eosinophils Relative: 0 %
HCT: 38.7 % (ref 36.0–46.0)
Hemoglobin: 12.8 g/dL (ref 12.0–15.0)
Immature Granulocytes: 0 %
Lymphocytes Relative: 11 %
Lymphs Abs: 1.1 10*3/uL (ref 0.7–4.0)
MCH: 30.5 pg (ref 26.0–34.0)
MCHC: 33.1 g/dL (ref 30.0–36.0)
MCV: 92.4 fL (ref 80.0–100.0)
Monocytes Absolute: 0.5 10*3/uL (ref 0.1–1.0)
Monocytes Relative: 5 %
Neutro Abs: 8.8 10*3/uL — ABNORMAL HIGH (ref 1.7–7.7)
Neutrophils Relative %: 84 %
Platelets: 131 10*3/uL — ABNORMAL LOW (ref 150–400)
RBC: 4.19 MIL/uL (ref 3.87–5.11)
RDW: 13.5 % (ref 11.5–15.5)
WBC: 10.5 10*3/uL (ref 4.0–10.5)
nRBC: 0 % (ref 0.0–0.2)

## 2020-08-22 LAB — D-DIMER, QUANTITATIVE: D-Dimer, Quant: 1.4 ug/mL-FEU — ABNORMAL HIGH (ref 0.00–0.50)

## 2020-08-22 LAB — COMPREHENSIVE METABOLIC PANEL
ALT: 19 U/L (ref 0–44)
AST: 26 U/L (ref 15–41)
Albumin: 3.7 g/dL (ref 3.5–5.0)
Alkaline Phosphatase: 52 U/L (ref 38–126)
Anion gap: 8 (ref 5–15)
BUN: 9 mg/dL (ref 8–23)
CO2: 23 mmol/L (ref 22–32)
Calcium: 7.5 mg/dL — ABNORMAL LOW (ref 8.9–10.3)
Chloride: 108 mmol/L (ref 98–111)
Creatinine, Ser: 0.66 mg/dL (ref 0.44–1.00)
GFR calc Af Amer: >60 mL/min (ref >60)
GFR calc non Af Amer: >60 mL/min (ref >60)
Glucose, Bld: 126 mg/dL — ABNORMAL HIGH (ref 70–99)
Potassium: 3.3 mmol/L — ABNORMAL LOW (ref 3.5–5.1)
Sodium: 139 mmol/L (ref 135–145)
Total Bilirubin: 0.5 mg/dL (ref 0.3–1.2)
Total Protein: 6.8 g/dL (ref 6.5–8.1)

## 2020-08-22 LAB — FIBRINOGEN: Fibrinogen: 366 mg/dL (ref 210–475)

## 2020-08-22 LAB — C-REACTIVE PROTEIN: CRP: 0.8 mg/dL (ref <1.0)

## 2020-08-22 LAB — LACTATE DEHYDROGENASE: LDH: 153 U/L (ref 98–192)

## 2020-08-22 LAB — LACTIC ACID, PLASMA: Lactic Acid, Venous: 1 mmol/L (ref 0.5–1.9)

## 2020-08-22 LAB — PROCALCITONIN: Procalcitonin: <0.1 ng/mL

## 2020-08-22 LAB — FERRITIN: Ferritin: 191 ng/mL (ref 11–307)

## 2020-08-22 LAB — TRIGLYCERIDES: Triglycerides: 65 mg/dL (ref <150)

## 2020-08-22 MED ORDER — ONDANSETRON 4 MG PO TBDP: 4.0000 mg | ORAL_TABLET | Freq: Three times a day (TID) | ORAL | 0 refills | Status: AC | PRN

## 2020-08-22 MED ORDER — ONDANSETRON HCL 4 MG/2ML IJ SOLN
4.0000 mg | Freq: Once | INTRAMUSCULAR | Status: AC
  Administered 2020-08-22: 4 mg via INTRAVENOUS
  Filled 2020-08-22: qty 2

## 2020-08-22 MED ORDER — SODIUM CHLORIDE 0.9 % IV BOLUS
1000.0000 mL | Freq: Once | INTRAVENOUS | Status: AC
  Administered 2020-08-22: 1000 mL via INTRAVENOUS

## 2020-08-22 MED ORDER — METHYLPREDNISOLONE SODIUM SUCC 125 MG IJ SOLR: 125.0000 mg | Freq: Once | INTRAMUSCULAR | Status: DC | PRN

## 2020-08-22 MED ORDER — SODIUM CHLORIDE 0.9 % IV SOLN
1000.0000 mL | INTRAVENOUS | Status: DC
  Administered 2020-08-22: 1000 mL via INTRAVENOUS

## 2020-08-22 MED ORDER — SODIUM CHLORIDE 0.9 % IV SOLN
1200.0000 mg | Freq: Once | INTRAVENOUS | Status: AC
  Administered 2020-08-22: 1200 mg via INTRAVENOUS

## 2020-08-22 MED ORDER — DIPHENHYDRAMINE HCL 50 MG/ML IJ SOLN: 50.0000 mg | Freq: Once | INTRAMUSCULAR | Status: DC | PRN

## 2020-08-22 MED ORDER — ALBUTEROL SULFATE HFA 108 (90 BASE) MCG/ACT IN AERS: 2.0000 | INHALATION_SPRAY | Freq: Once | RESPIRATORY_TRACT | Status: DC | PRN

## 2020-08-22 MED ORDER — PROMETHAZINE HCL 25 MG/ML IJ SOLN
12.5000 mg | Freq: Once | INTRAMUSCULAR | Status: DC
  Filled 2020-08-22: qty 1

## 2020-08-22 MED ORDER — SODIUM CHLORIDE 0.9 % IV SOLN: INTRAVENOUS | Status: DC | PRN

## 2020-08-22 MED ORDER — FAMOTIDINE IN NACL 20-0.9 MG/50ML-% IV SOLN: 20.0000 mg | Freq: Once | INTRAVENOUS | Status: DC | PRN

## 2020-08-22 MED ORDER — EPINEPHRINE 0.3 MG/0.3ML IJ SOAJ: 0.3000 mg | Freq: Once | INTRAMUSCULAR | Status: DC | PRN

## 2020-08-22 NOTE — Discharge Instructions (Signed)

## 2020-08-22 NOTE — ED Notes (Signed)
Patient states that her nausea is much better.

## 2020-08-22 NOTE — ED Provider Notes (Signed)
[facility] DEPT Provider Note   CSN: [id number] Arrival date & time: 08/22/20  1837     History Chief Complaint  Patient presents with  . Covid Positive  . Diarrhea    Rebecca Stone is a 74 y.o. female.  Pt presents to the ED today with syncope.  Pt was diagnosed with Covid on 9/20.  Scanned result in Epic.  Pt has had a lot of n/v/d since diagnosis.  Pt had her mab infusion here at [facility] today and did well.  She went home and had a syncopal event.  She was given 600 ml of NS by EMS.  Pt has not been vaccinated.        Past Medical History:  Diagnosis Date  . Sinus trouble    occ.  Marland Kitchen Spinal stenosis    surgery planned    Patient Active Problem List   Diagnosis Date Noted  . Spinal stenosis, lumbar region, with neurogenic claudication 08/02/2014  . Paresthesias 03/09/2014    Past Surgical History:  Procedure Laterality Date  . DECOMPRESSIVE LUMBAR LAMINECTOMY LEVEL 1 N/A 08/02/2014   Procedure: CENTRAL DECOMPRESSIVE LUMBAR LAMINECTOMY L4 - L5 LEVEL 1;  Surgeon: Tobi Bastos, MD;  Location: [facility];  Service: Orthopedics;  Laterality: N/A;  . MYOMECTOMY    . TUBAL LIGATION       OB History   No obstetric history on file.     Family History  Problem Relation Age of Onset  . Diabetes Mother   . Hypertension Mother   . Heart disease Mother   . Heart attack Mother   . Alzheimer's disease Father   . Diabetes Sister   . Cancer Brother        Lung    Social History   Tobacco Use  . Smoking status: Never Smoker  . Smokeless tobacco: Never Used  Substance Use Topics  . Alcohol use: No  . Drug use: No    Home Medications Prior to Admission medications   Medication Sig Start Date End Date Taking? Authorizing Provider  cetirizine (ZYRTEC) 10 MG tablet Take 1 tablet by mouth daily as needed. 01/26/19  Yes [provider]  ondansetron (ZOFRAN ODT) 4 MG disintegrating tablet Take 1 tablet (4 mg total) by mouth every 8  (eight) hours as needed for nausea or vomiting. 08/22/20   Isla Pence, MD    Allergies    Anesthesia s-i-40 [propofol] and Contrast media [iodinated diagnostic agents]  Review of Systems   Review of Systems  Constitutional: Positive for fatigue.  Respiratory: Positive for cough.   Gastrointestinal: Positive for diarrhea, nausea and vomiting.  All other systems reviewed and are negative.   Physical Exam Updated Vital Signs BP 138/70 (BP Location: Right Arm)   Pulse 93   Temp 97.7 F (36.5 C) (Oral)   Resp 19   SpO2 98%   Physical Exam Vitals and nursing note reviewed.  Constitutional:      Appearance: Normal appearance.  HENT:     Head: Normocephalic and atraumatic.     Right Ear: External ear normal.     Left Ear: External ear normal.     Nose: Nose normal.     Mouth/Throat:     Mouth: Mucous membranes are dry.  Eyes:     Extraocular Movements: Extraocular movements intact.     Conjunctiva/sclera: Conjunctivae normal.     Pupils: Pupils are equal, round, and reactive to light.  Cardiovascular:     Rate and  Rhythm: Normal rate and regular rhythm.     Pulses: Normal pulses.     Heart sounds: Normal heart sounds.  Pulmonary:     Effort: Pulmonary effort is normal.     Breath sounds: Normal breath sounds.  Abdominal:     General: Abdomen is flat. Bowel sounds are normal.     Palpations: Abdomen is soft.  Musculoskeletal:        General: Normal range of motion.     Cervical back: Normal range of motion and neck supple.  Skin:    General: Skin is warm.     Capillary Refill: Capillary refill takes less than 2 seconds.  Neurological:     General: No focal deficit present.     Mental Status: She is alert and oriented to person, place, and time.  Psychiatric:        Mood and Affect: Mood normal.        Behavior: Behavior normal.        Thought Content: Thought content normal.        Judgment: Judgment normal.     ED Results / Procedures / Treatments    Labs (all labs ordered are listed, but only abnormal results are displayed) Labs Reviewed  CBC WITH DIFFERENTIAL/PLATELET - Abnormal; Notable for the following components:      Result Value   Platelets 131 (*)    Neutro Abs 8.8 (*)    All other components within normal limits  COMPREHENSIVE METABOLIC PANEL - Abnormal; Notable for the following components:   Potassium 3.3 (*)    Glucose, Bld 126 (*)    Calcium 7.5 (*)    All other components within normal limits  D-DIMER, QUANTITATIVE (NOT AT [facility]) - Abnormal; Notable for the following components:   D-Dimer, Quant 1.40 (*)    All other components within normal limits  CULTURE, BLOOD (ROUTINE X 2)  CULTURE, BLOOD (ROUTINE X 2)  LACTIC ACID, PLASMA  PROCALCITONIN  LACTATE DEHYDROGENASE  FERRITIN  TRIGLYCERIDES  FIBRINOGEN  C-REACTIVE PROTEIN  LACTIC ACID, PLASMA    EKG EKG Interpretation  Date/Time:  Wednesday August 22 2020 20:05:36 EDT Ventricular Rate:  91 PR Interval:    QRS Duration: 95 QT Interval:  380 QTC Calculation: 468 R Axis:   49 Text Interpretation: Sinus rhythm Low voltage, precordial leads No significant change since last tracing Confirmed by Isla Pence [phone]) on 08/22/2020 9:23:28 PM   Radiology DG Chest Port 1 View  Result Date: 08/22/2020 CLINICAL DATA:  COVID-19 positive 3 days ago, monoclonal antibody infusion today, fever, chills EXAM: PORTABLE CHEST 1 VIEW COMPARISON:  07/02/2018 FINDINGS: Single frontal view of the chest demonstrates an unremarkable cardiac silhouette. No airspace disease, effusion, or pneumothorax. No acute bony abnormalities. IMPRESSION: 1. No acute intrathoracic process. Electronically Signed   By: Randa Ngo M.D.   On: 08/22/2020 19:39    Procedures Procedures (including critical care time)  Medications Ordered in ED Medications  0.9 %  sodium chloride infusion (0 mLs Intravenous Stopped 08/22/20 2221)  promethazine (PHENERGAN) injection 12.5 mg (has no  administration in time range)  ondansetron (ZOFRAN) injection 4 mg (4 mg Intravenous Given 08/22/20 2000)  sodium chloride 0.9 % bolus 1,000 mL (0 mLs Intravenous Stopped 08/22/20 2221)    ED Course  I have reviewed the triage vital signs and the nursing notes.  Pertinent labs & imaging results that were available during my care of the patient were reviewed by me and considered in my medical decision making (  see chart for details).    MDM Rules/Calculators/A&P                          Pt is orthostatic upon arrival.  She has had a strong component of the GI symptoms with Covid.  She is now able to keep down fluids and is no longer orthostatic.  She is not hypoxic and her CXR is clear.  She does not want to go home because she wants someone to take care of her.  She is also worried about her husband who is also not vaccinated.  She is encouraged to wear a mask at home and stay isolated.  She is to f/u in the post covid clinic.  Return if worse.  Aldina Fanguy was evaluated in Emergency Department on 08/22/2020 for the symptoms described in the history of present illness. She was evaluated in the context of the global COVID-19 pandemic, which necessitated consideration that the patient might be at risk for infection with the SARS-CoV-2 virus that causes COVID-19. Institutional protocols and algorithms that pertain to the evaluation of patients at risk for COVID-19 are in a state of rapid change based on information released by regulatory bodies including the CDC and federal and state organizations. These policies and algorithms were followed during the patient's care in the ED.   Final Clinical Impression(s) / ED Diagnoses Final diagnoses:  Dehydration  COVID-19 virus infection  Orthostatic hypotension    Rx / DC Orders ED Discharge Orders         Ordered    ondansetron (ZOFRAN ODT) 4 MG disintegrating tablet  Every 8 hours PRN        08/22/20 2318           Isla Pence,  MD 08/22/20 2322

## 2020-08-22 NOTE — ED Triage Notes (Addendum)
Patient arrived via [place].  covid POSTIVE 3 DAYS AGO 19TH  Patient reports she had infusion today.  Called out for fever, chills, emesis, and diarrhea.   A/Ox4 Ambulatory   BP-139/90 CBG-328 97.7 temp 98% RA P-80   600Ns mls fluid give 18 Left AC.

## 2020-08-22 NOTE — Progress Notes (Signed)
  Diagnosis: COVID-19  Physician: Dr. Asencion Noble  Procedure: Covid Infusion Clinic Med: casirivimab\imdevimab infusion - Provided patient with casirivimab\imdevimab fact sheet for patients, parents and caregivers prior to infusion.  Complications: No immediate complications noted.  Discharge: Discharged home   Lorrin Jackson 08/22/2020

## 2020-08-24 ENCOUNTER — Telehealth: Payer: Self-pay | Admitting: Family Medicine

## 2020-08-24 NOTE — Telephone Encounter (Signed)
Pt declined to make an appt w/ [facility] after being referred by [facility]

## 2020-08-27 LAB — CULTURE, BLOOD (ROUTINE X 2)
Culture: NO GROWTH
Culture: NO GROWTH

## 2021-02-24 IMAGING — DX DG CHEST 1V PORT
1 series · 1 of 1 positions shown · non-contrast
Comparison: 07/02/2018

CLINICAL DATA: [id number] positive 3 days ago, monoclonal antibody
infusion today, fever, chills

EXAM:
PORTABLE CHEST 1 VIEW

[chest ap]
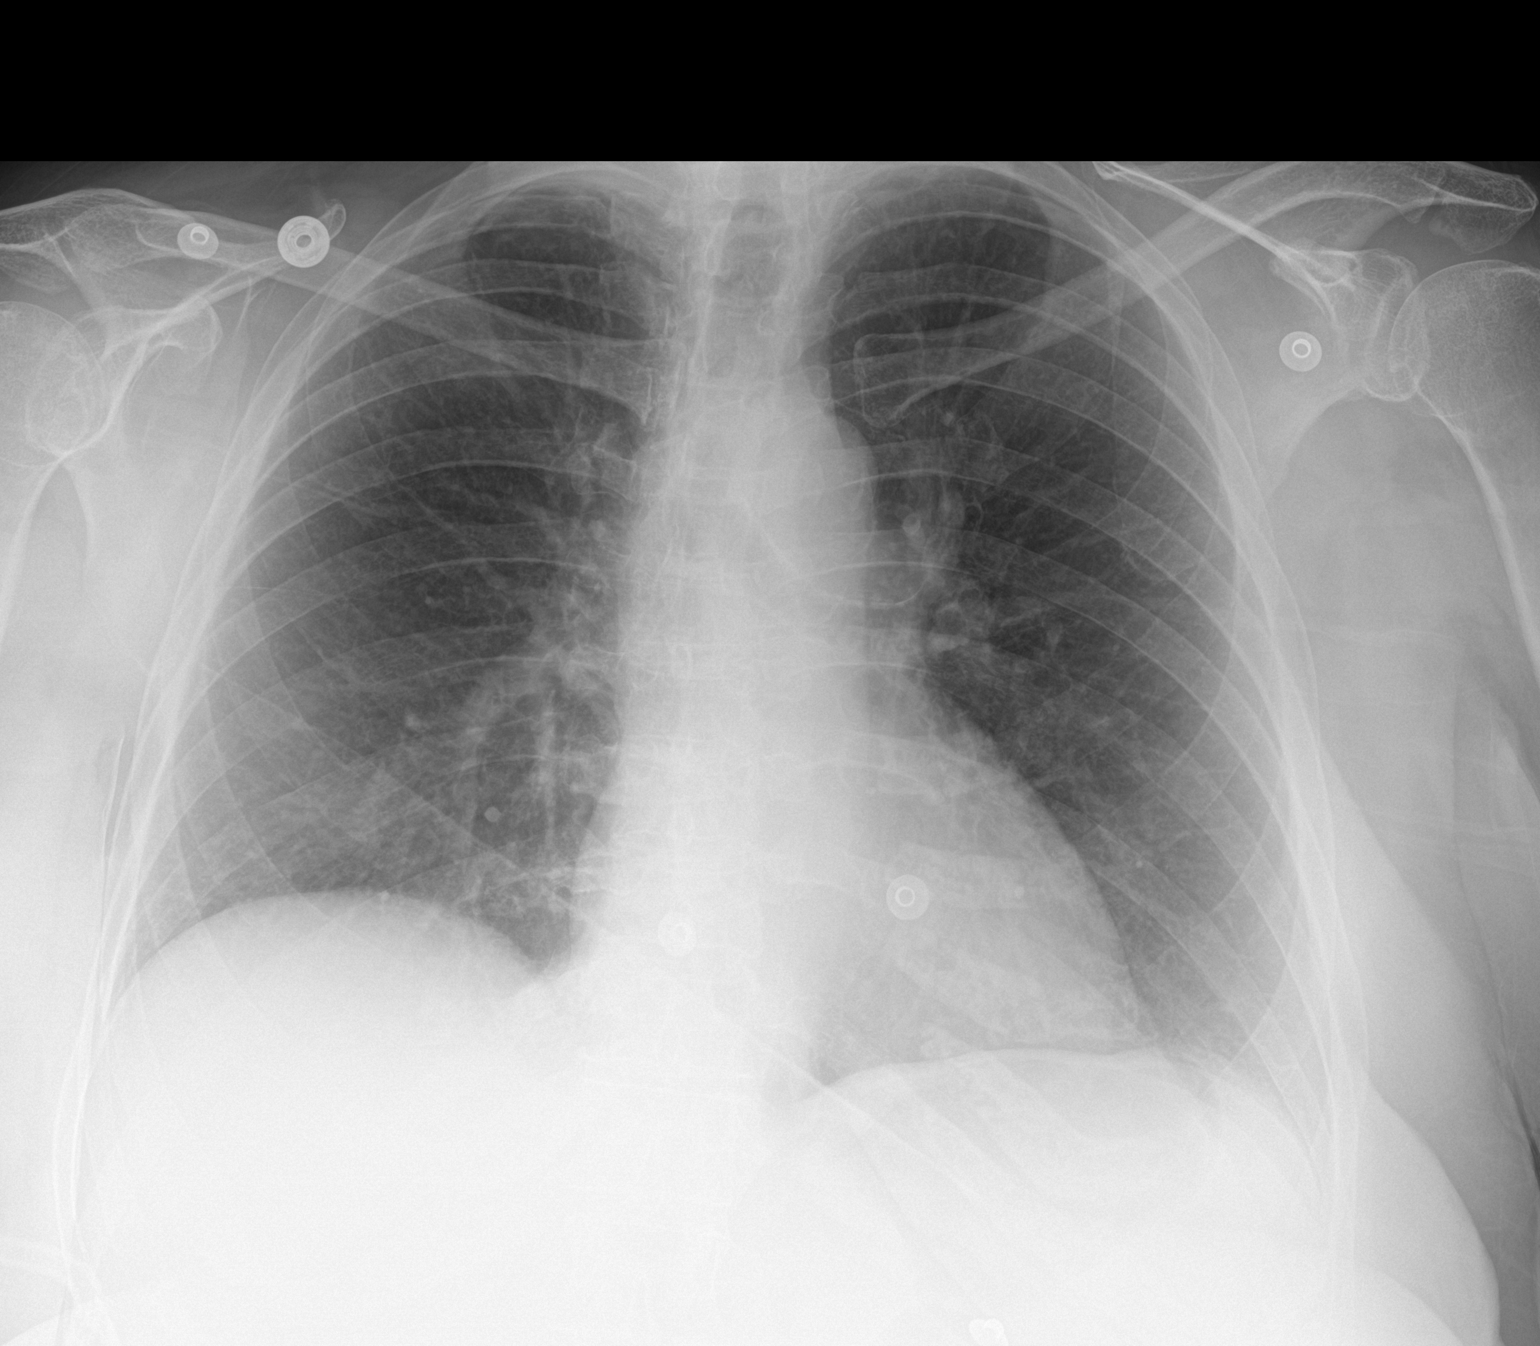

[1 of 1 positions shown; findings below may reference images not displayed]

FINDINGS: Single frontal view of the chest demonstrates an unremarkable
cardiac silhouette. No airspace disease, effusion, or pneumothorax.
No acute bony abnormalities.
IMPRESSION: 1. No acute intrathoracic process.

## 2021-08-13 DIAGNOSIS — N390 Urinary tract infection, site not specified: Secondary | ICD-10-CM | POA: Diagnosis not present

## 2021-09-06 DIAGNOSIS — L820 Inflamed seborrheic keratosis: Secondary | ICD-10-CM | POA: Diagnosis not present

## 2021-10-02 DIAGNOSIS — S0086XA Insect bite (nonvenomous) of other part of head, initial encounter: Secondary | ICD-10-CM | POA: Diagnosis not present

## 2021-10-02 DIAGNOSIS — W57XXXA Bitten or stung by nonvenomous insect and other nonvenomous arthropods, initial encounter: Secondary | ICD-10-CM | POA: Diagnosis not present

## 2021-10-02 DIAGNOSIS — L089 Local infection of the skin and subcutaneous tissue, unspecified: Secondary | ICD-10-CM | POA: Diagnosis not present

## 2021-10-02 DIAGNOSIS — R300 Dysuria: Secondary | ICD-10-CM | POA: Diagnosis not present

## 2021-10-15 DIAGNOSIS — Z1231 Encounter for screening mammogram for malignant neoplasm of breast: Secondary | ICD-10-CM | POA: Diagnosis not present

## 2021-10-31 DIAGNOSIS — N390 Urinary tract infection, site not specified: Secondary | ICD-10-CM | POA: Diagnosis not present

## 2021-11-14 DIAGNOSIS — E785 Hyperlipidemia, unspecified: Secondary | ICD-10-CM | POA: Diagnosis not present

## 2021-11-14 DIAGNOSIS — J439 Emphysema, unspecified: Secondary | ICD-10-CM | POA: Diagnosis not present

## 2021-11-14 DIAGNOSIS — Z136 Encounter for screening for cardiovascular disorders: Secondary | ICD-10-CM | POA: Diagnosis not present

## 2021-11-14 DIAGNOSIS — N390 Urinary tract infection, site not specified: Secondary | ICD-10-CM | POA: Diagnosis not present

## 2021-11-14 DIAGNOSIS — Z0000 Encounter for general adult medical examination without abnormal findings: Secondary | ICD-10-CM | POA: Diagnosis not present

## 2021-11-14 DIAGNOSIS — Z131 Encounter for screening for diabetes mellitus: Secondary | ICD-10-CM | POA: Diagnosis not present

## 2021-12-24 DIAGNOSIS — C44729 Squamous cell carcinoma of skin of left lower limb, including hip: Secondary | ICD-10-CM | POA: Diagnosis not present

## 2021-12-24 DIAGNOSIS — B070 Plantar wart: Secondary | ICD-10-CM | POA: Diagnosis not present

## 2022-01-30 DIAGNOSIS — N390 Urinary tract infection, site not specified: Secondary | ICD-10-CM | POA: Diagnosis not present

## 2022-01-31 DIAGNOSIS — L820 Inflamed seborrheic keratosis: Secondary | ICD-10-CM | POA: Diagnosis not present

## 2022-01-31 DIAGNOSIS — Z85828 Personal history of other malignant neoplasm of skin: Secondary | ICD-10-CM | POA: Diagnosis not present

## 2022-01-31 DIAGNOSIS — Z08 Encounter for follow-up examination after completed treatment for malignant neoplasm: Secondary | ICD-10-CM | POA: Diagnosis not present

## 2022-07-08 DIAGNOSIS — R300 Dysuria: Secondary | ICD-10-CM | POA: Diagnosis not present

## 2022-07-08 DIAGNOSIS — N390 Urinary tract infection, site not specified: Secondary | ICD-10-CM | POA: Diagnosis not present

## 2022-07-24 DIAGNOSIS — H353131 Nonexudative age-related macular degeneration, bilateral, early dry stage: Secondary | ICD-10-CM | POA: Diagnosis not present

## 2022-07-24 DIAGNOSIS — H26491 Other secondary cataract, right eye: Secondary | ICD-10-CM | POA: Diagnosis not present

## 2022-08-08 DIAGNOSIS — H26492 Other secondary cataract, left eye: Secondary | ICD-10-CM | POA: Diagnosis not present

## 2022-12-20 DIAGNOSIS — R051 Acute cough: Secondary | ICD-10-CM | POA: Diagnosis not present

## 2022-12-20 DIAGNOSIS — N3001 Acute cystitis with hematuria: Secondary | ICD-10-CM | POA: Diagnosis not present

## 2022-12-20 DIAGNOSIS — H1033 Unspecified acute conjunctivitis, bilateral: Secondary | ICD-10-CM | POA: Diagnosis not present

## 2023-08-31 DIAGNOSIS — Z03818 Encounter for observation for suspected exposure to other biological agents ruled out: Secondary | ICD-10-CM | POA: Diagnosis not present

## 2023-08-31 DIAGNOSIS — R051 Acute cough: Secondary | ICD-10-CM | POA: Diagnosis not present

## 2023-08-31 DIAGNOSIS — J00 Acute nasopharyngitis [common cold]: Secondary | ICD-10-CM | POA: Diagnosis not present

## 2023-09-13 DIAGNOSIS — R051 Acute cough: Secondary | ICD-10-CM | POA: Diagnosis not present

## 2023-09-13 DIAGNOSIS — N390 Urinary tract infection, site not specified: Secondary | ICD-10-CM | POA: Diagnosis not present

## 2023-09-13 DIAGNOSIS — J209 Acute bronchitis, unspecified: Secondary | ICD-10-CM | POA: Diagnosis not present

## 2023-09-24 DIAGNOSIS — Z961 Presence of intraocular lens: Secondary | ICD-10-CM | POA: Diagnosis not present

## 2023-09-24 DIAGNOSIS — H02831 Dermatochalasis of right upper eyelid: Secondary | ICD-10-CM | POA: Diagnosis not present

## 2023-09-24 DIAGNOSIS — H04123 Dry eye syndrome of bilateral lacrimal glands: Secondary | ICD-10-CM | POA: Diagnosis not present

## 2023-09-24 DIAGNOSIS — H353131 Nonexudative age-related macular degeneration, bilateral, early dry stage: Secondary | ICD-10-CM | POA: Diagnosis not present

## 2023-09-24 DIAGNOSIS — H02834 Dermatochalasis of left upper eyelid: Secondary | ICD-10-CM | POA: Diagnosis not present

## 2023-09-24 DIAGNOSIS — H47321 Drusen of optic disc, right eye: Secondary | ICD-10-CM | POA: Diagnosis not present

## 2023-09-24 DIAGNOSIS — H526 Other disorders of refraction: Secondary | ICD-10-CM | POA: Diagnosis not present

## 2023-09-30 DIAGNOSIS — H524 Presbyopia: Secondary | ICD-10-CM | POA: Diagnosis not present

## 2023-10-09 DIAGNOSIS — N390 Urinary tract infection, site not specified: Secondary | ICD-10-CM | POA: Diagnosis not present

## 2023-11-20 ENCOUNTER — Ambulatory Visit: Payer: Medicare HMO

## 2023-11-20 ENCOUNTER — Other Ambulatory Visit: Payer: Self-pay | Admitting: Family Medicine

## 2023-11-20 DIAGNOSIS — Z0000 Encounter for general adult medical examination without abnormal findings: Secondary | ICD-10-CM | POA: Diagnosis not present

## 2023-11-20 DIAGNOSIS — I700 Atherosclerosis of aorta: Secondary | ICD-10-CM | POA: Diagnosis not present

## 2023-11-20 DIAGNOSIS — Z131 Encounter for screening for diabetes mellitus: Secondary | ICD-10-CM | POA: Diagnosis not present

## 2023-11-20 DIAGNOSIS — E785 Hyperlipidemia, unspecified: Secondary | ICD-10-CM | POA: Diagnosis not present

## 2023-11-20 DIAGNOSIS — M25552 Pain in left hip: Secondary | ICD-10-CM | POA: Diagnosis not present

## 2023-11-20 DIAGNOSIS — J439 Emphysema, unspecified: Secondary | ICD-10-CM | POA: Diagnosis not present

## 2023-12-17 DIAGNOSIS — R051 Acute cough: Secondary | ICD-10-CM | POA: Diagnosis not present

## 2023-12-17 DIAGNOSIS — J439 Emphysema, unspecified: Secondary | ICD-10-CM | POA: Diagnosis not present

## 2023-12-17 DIAGNOSIS — Z03818 Encounter for observation for suspected exposure to other biological agents ruled out: Secondary | ICD-10-CM | POA: Diagnosis not present

## 2023-12-17 DIAGNOSIS — N390 Urinary tract infection, site not specified: Secondary | ICD-10-CM | POA: Diagnosis not present

## 2024-08-17 DIAGNOSIS — R519 Headache, unspecified: Secondary | ICD-10-CM | POA: Diagnosis not present

## 2024-08-17 DIAGNOSIS — Z6831 Body mass index (BMI) 31.0-31.9, adult: Secondary | ICD-10-CM | POA: Diagnosis not present

## 2024-08-17 DIAGNOSIS — J019 Acute sinusitis, unspecified: Secondary | ICD-10-CM | POA: Diagnosis not present

## 2024-10-01 ENCOUNTER — Encounter (HOSPITAL_BASED_OUTPATIENT_CLINIC_OR_DEPARTMENT_OTHER): Payer: Self-pay | Admitting: Emergency Medicine

## 2024-10-01 ENCOUNTER — Emergency Department (HOSPITAL_BASED_OUTPATIENT_CLINIC_OR_DEPARTMENT_OTHER)
Admission: EM | Admit: 2024-10-01 | Discharge: 2024-10-01 | Disposition: A | Discharge status: Home / Self Care | Attending: Emergency Medicine | Admitting: Emergency Medicine

## 2024-10-01 ENCOUNTER — Emergency Department (HOSPITAL_BASED_OUTPATIENT_CLINIC_OR_DEPARTMENT_OTHER)

## 2024-10-01 DIAGNOSIS — G8929 Other chronic pain: Secondary | ICD-10-CM | POA: Insufficient documentation

## 2024-10-01 DIAGNOSIS — R519 Headache, unspecified: Secondary | ICD-10-CM | POA: Insufficient documentation

## 2024-10-01 DIAGNOSIS — R110 Nausea: Secondary | ICD-10-CM | POA: Insufficient documentation

## 2024-10-01 LAB — CBC
HCT: 39.7 % (ref 36.0–46.0)
Hemoglobin: 13.2 g/dL (ref 12.0–15.0)
MCH: 30.3 pg (ref 26.0–34.0)
MCHC: 33.2 g/dL (ref 30.0–36.0)
MCV: 91.1 fL (ref 80.0–100.0)
Platelets: 179 10*3/uL (ref 150–400)
RBC: 4.36 MIL/uL (ref 3.87–5.11)
RDW: 13 % (ref 11.5–15.5)
WBC: 8.4 10*3/uL (ref 4.0–10.5)
nRBC: 0 % (ref 0.0–0.2)

## 2024-10-01 LAB — DIFFERENTIAL
Abs Immature Granulocytes: 0.02 10*3/uL (ref 0.00–0.07)
Basophils Absolute: 0 10*3/uL (ref 0.0–0.1)
Basophils Relative: 0 %
Eosinophils Absolute: 0.2 10*3/uL (ref 0.0–0.5)
Eosinophils Relative: 2 %
Immature Granulocytes: 0 %
Lymphocytes Relative: 44 %
Lymphs Abs: 3.7 10*3/uL (ref 0.7–4.0)
Monocytes Absolute: 0.8 10*3/uL (ref 0.1–1.0)
Monocytes Relative: 10 %
Neutro Abs: 3.7 10*3/uL (ref 1.7–7.7)
Neutrophils Relative %: 44 %

## 2024-10-01 LAB — BASIC METABOLIC PANEL WITH GFR
Anion gap: 10 (ref 5–15)
BUN: 11 mg/dL (ref 8–23)
CO2: 27 mmol/L (ref 22–32)
Calcium: 9.9 mg/dL (ref 8.9–10.3)
Chloride: 101 mmol/L (ref 98–111)
Creatinine, Ser: 0.78 mg/dL (ref 0.44–1.00)
GFR, Estimated: >60 mL/min
Glucose, Bld: 96 mg/dL (ref 70–99)
Potassium: 3.7 mmol/L (ref 3.5–5.1)
Sodium: 139 mmol/L (ref 135–145)

## 2024-10-01 MED ORDER — ACETAMINOPHEN 500 MG PO TABS
1000.0000 mg | ORAL_TABLET | Freq: Once | ORAL | Status: AC
  Administered 2024-10-01: 1000 mg via ORAL
  Filled 2024-10-01: qty 2

## 2024-10-01 MED ORDER — DIPHENHYDRAMINE HCL 50 MG/ML IJ SOLN
12.5000 mg | Freq: Once | INTRAMUSCULAR | Status: AC
  Administered 2024-10-01: 12.5 mg via INTRAVENOUS
  Filled 2024-10-01: qty 1

## 2024-10-01 MED ORDER — DEXAMETHASONE SOD PHOSPHATE PF 10 MG/ML IJ SOLN
10.0000 mg | Freq: Once | INTRAMUSCULAR | Status: AC
  Administered 2024-10-01: 10 mg via INTRAVENOUS

## 2024-10-01 MED ORDER — KETOROLAC TROMETHAMINE 10 MG PO TABS: 10.0000 mg | ORAL_TABLET | Freq: Four times a day (QID) | ORAL | 0 refills | Status: AC | PRN

## 2024-10-01 MED ORDER — METOCLOPRAMIDE HCL 5 MG/ML IJ SOLN
10.0000 mg | Freq: Once | INTRAMUSCULAR | Status: AC
  Administered 2024-10-01: 10 mg via INTRAVENOUS
  Filled 2024-10-01: qty 2

## 2024-10-01 MED ORDER — BUPIVACAINE HCL 0.5 % IJ SOLN
20.0000 mL | Freq: Once | INTRAMUSCULAR | Status: DC
  Filled 2024-10-01: qty 1

## 2024-10-01 NOTE — ED Triage Notes (Signed)
 Daily headache since beginning of October Getting worse Worse at night and when lying down Loss of appetite Not relieved with OTC meds

## 2024-10-01 NOTE — Discharge Instructions (Addendum)
 Your CT imaging showed no acute intracranial abnormality with no evidence of mass.  Your symptoms improved with a headache cocktail.  Your symptoms could be consistent with occipital neuralgia.  You had a normal neurologic exam and your labs were unremarkable.  Please follow-up outpatient with neurology, a referral has been placed.  Recommend Tylenol  1 g every 6 hours in addition to oral Toradol instead of Motrin

## 2024-10-01 NOTE — ED Notes (Signed)
 Patient transported to CT

## 2024-10-01 NOTE — ED Provider Notes (Signed)
 [facility] AT [facility] Provider Note   CSN: [id number] Arrival date & time: 10/01/24  1459     Patient presents with: Headache   Rebecca Stone is a 78 y.o. female.    Headache    78 year old female with medical history significant for spinal stenosis status post decompressive lumbar laminectomy in 2015 presenting to the emergency department with a headache.  The patient states that she has had headaches since the beginning of October.  Symptoms have been increasing in severity.  They are worse at night and when lying flat.  She has had some associated nausea, no vomiting.  She denies any fevers, chills or neck stiffness.  She has had no vision loss, double vision, blurred vision.  She denies any focal weakness or numbness in any extremity, no facial droop or difficulty swallowing or speaking.  Headache has not been relieved with over-the-counter medications.  It is located posteriorly in the occiput and radiates forward to her forehead.  Her headache was not sudden onset or maximal in onset when it came on 4 weeks ago.  She denies any jaw claudication.  Prior to Admission medications   Medication Sig Start Date End Date Taking? Authorizing Provider  ketorolac (TORADOL) 10 MG tablet Take 1 tablet (10 mg total) by mouth every 6 (six) hours as needed. 10/01/24  Yes Jerrol Agent, MD  cetirizine (ZYRTEC) 10 MG tablet Take 1 tablet by mouth daily as needed. 01/26/19   [provider]  ondansetron  (ZOFRAN  ODT) 4 MG disintegrating tablet Take 1 tablet (4 mg total) by mouth every 8 (eight) hours as needed for nausea or vomiting. 08/22/20   Dean Clarity, MD    Allergies: Anesthesia s-i-40 [propofol ] and Contrast media [iodinated contrast media]    Review of Systems  Neurological:  Positive for headaches.  All other systems reviewed and are negative.   Updated Vital Signs BP (!) 149/79   Pulse 88   Temp 97.9 F (36.6 C) (Temporal)   Resp 18    SpO2 98%   Physical Exam Vitals and nursing note reviewed.  Constitutional:      General: She is not in acute distress.    Appearance: She is well-developed.  HENT:     Head: Normocephalic and atraumatic.  Eyes:     General: No visual field deficit.    Conjunctiva/sclera: Conjunctivae normal.  Cardiovascular:     Rate and Rhythm: Normal rate and regular rhythm.     Heart sounds: No murmur heard.    Comments: No meningismus Pulmonary:     Effort: Pulmonary effort is normal. No respiratory distress.     Breath sounds: Normal breath sounds.  Abdominal:     Palpations: Abdomen is soft.     Tenderness: There is no abdominal tenderness.  Musculoskeletal:        General: No swelling.     Cervical back: Neck supple.  Skin:    General: Skin is warm and dry.     Capillary Refill: Capillary refill takes less than 2 seconds.  Neurological:     Mental Status: She is alert.     GCS: GCS eye subscore is 4. GCS verbal subscore is 5. GCS motor subscore is 6.     Cranial Nerves: No cranial nerve deficit, dysarthria or facial asymmetry.     Sensory: No sensory deficit.     Motor: No weakness.  Psychiatric:        Mood and Affect: Mood normal.     (  all labs ordered are listed, but only abnormal results are displayed) Labs Reviewed  BASIC METABOLIC PANEL WITH GFR  CBC  DIFFERENTIAL  CBC WITH DIFFERENTIAL/PLATELET    EKG: None  Radiology: CT HEAD WO CONTRAST Result Date: 10/01/2024 EXAM: CT HEAD WITHOUT CONTRAST 10/01/2024 04:52:44 PM TECHNIQUE: CT of the head was performed without the administration of intravenous contrast. Automated exposure control, iterative reconstruction, and/or weight based adjustment of the mA/kV was utilized to reduce the radiation dose to as low as reasonably achievable. COMPARISON: None available. CLINICAL HISTORY: Headache, increasing frequency or severity FINDINGS: BRAIN AND VENTRICLES: No acute hemorrhage. No evidence of acute infarct. No hydrocephalus. No  extra-axial collection. No mass effect or midline shift. ORBITS: No acute abnormality. SINUSES: No acute abnormality. SOFT TISSUES AND SKULL: No acute soft tissue abnormality. No skull fracture. IMPRESSION: 1. No acute intracranial abnormality. Electronically signed by: Franky Crease MD 10/01/2024 05:07 PM EDT RP Workstation: [id number]     Procedures   Medications Ordered in the ED  bupivacaine  (MARCAINE ) 0.5 % (with pres) injection 20 mL (has no administration in time range)  metoCLOPramide  (REGLAN ) injection 10 mg (10 mg Intravenous Given 10/01/24 1637)  diphenhydrAMINE  (BENADRYL ) injection 12.5 mg (12.5 mg Intravenous Given 10/01/24 1637)  acetaminophen  (TYLENOL ) tablet 1,000 mg (1,000 mg Oral Given 10/01/24 1603)  dexamethasone  (DECADRON ) injection 10 mg (10 mg Intravenous Given 10/01/24 1637)                                    Medical Decision Making Amount and/or Complexity of Data Reviewed Labs: ordered. Radiology: ordered.  Risk OTC drugs. Prescription drug management.    78 year old female with medical history significant for spinal stenosis status post decompressive lumbar laminectomy in 2015 presenting to the emergency department with a headache.  The patient states that she has had headaches since the beginning of October.  Symptoms have been increasing in severity.  They are worse at night and when lying flat.  She has had some associated nausea, no vomiting.  She denies any fevers, chills or neck stiffness.  She has had no vision loss, double vision, blurred vision.  She denies any focal weakness or numbness in any extremity, no facial droop or difficulty swallowing or speaking.  Headache has not been relieved with over-the-counter medications.  It is located posteriorly in the occiput and radiates forward to her forehead.  Her headache was not sudden onset or maximal in onset when it came on 4 weeks ago.  She denies any jaw claudication.  Rebecca Stone is a 78 y.o. female who  presents with headache for one month as per above. I have reviewed the nursing documentation for past medical history, family history, and social history. I have reviewed the EMR and have learned that she has no history of headaches.  On arrival, the patient was vitally stable is awake, alert, GCS 15, HDS, and afebrile. Her exam is most notable for fully intact extraocular motions with bilaterally reactive pupils, no focal neurologic deficits, no meningismus, and no temporal tenderness. There is no rash. The headache was not sudden onset or the worst headache of the patient's life. There is no visual deficit.  I am most concerned for chronic tension type headache.  To further evaluate and risk stratify her, labs and imaging were obtained, which were significant for:  Labs: CBC without a leukocytosis or anemia, BMP unremarkable Imaging:  CT Head: IMPRESSION:  1.  No acute intracranial abnormality.     I do not think the patient has an aneurysm, intracranial bleed, mass lesion, meningitis, temporal arteritis, stroke, cluster headache, idiopathic intracranial hypertension, cavernous sinus thrombosis, carbon monoxide toxicity, herpes zoster, carotid or vertebral artery dissection, or acute angle close glaucoma.  On reassessment, the patient remained well appearing and was again able to ambulate without difficulty and did not have any focal neurologic deficits.  I believe the patient is stable for discharge.  We participated in shared decision making regarding continued observation in the ED versus discharge home for continued recovery after receiving the below medications. She preferred to recover at home. I believe that this is safe and reasonable.  We have discussed the diagnosis and risks, and we agree with discharging home to follow-up with their primary doctor. We also discussed returning to the Emergency Department immediately if new or worsening symptoms occur. We have discussed the symptoms  which are most concerning (e.g., changing or worsening pain, weakness, vomiting, fever, or abnormal sensation) that necessitate immediate return. I provided ED return precautions. The patient felt safe with this plan.  ED Medication Summary: Medications  bupivacaine  (MARCAINE ) 0.5 % (with pres) injection 20 mL (has no administration in time range)  metoCLOPramide  (REGLAN ) injection 10 mg (10 mg Intravenous Given 10/01/24 1637)  diphenhydrAMINE  (BENADRYL ) injection 12.5 mg (12.5 mg Intravenous Given 10/01/24 1637)  acetaminophen  (TYLENOL ) tablet 1,000 mg (1,000 mg Oral Given 10/01/24 1603)  dexamethasone  (DECADRON ) injection 10 mg (10 mg Intravenous Given 10/01/24 1637)        Final diagnoses:  Chronic nonintractable headache, unspecified headache type    ED Discharge Orders          Ordered    Ambulatory referral to Neurology       Comments: An appointment is requested in approximately: 2 weeks   10/01/24 1801    ketorolac (TORADOL) 10 MG tablet  Every 6 hours PRN        10/01/24 1802               Jerrol Agent, MD 10/01/24 1804

## 2024-10-10 ENCOUNTER — Telehealth (HOSPITAL_BASED_OUTPATIENT_CLINIC_OR_DEPARTMENT_OTHER): Payer: Self-pay | Admitting: Emergency Medicine

## 2024-10-10 DIAGNOSIS — R519 Headache, unspecified: Secondary | ICD-10-CM

## 2024-10-10 NOTE — Telephone Encounter (Signed)
 I received a message from [facility] that this patient had been dismissed from their practice. Had presented for chronic headaches. Updated outpatient referral to [facility] neurology.
